# Patient Record
Sex: Female | Born: 1970 | Race: White | Hispanic: No | Marital: Married | Smoking: Never smoker
Health system: Southern US, Community
[De-identification: ages and names within clinical notes are randomized; demographics above are authoritative.]

## PROBLEM LIST (undated history)

## (undated) DIAGNOSIS — Z803 Family history of malignant neoplasm of breast: Secondary | ICD-10-CM

## (undated) DIAGNOSIS — F419 Anxiety disorder, unspecified: Secondary | ICD-10-CM

## (undated) DIAGNOSIS — M6289 Other specified disorders of muscle: Secondary | ICD-10-CM

## (undated) DIAGNOSIS — F32A Depression, unspecified: Secondary | ICD-10-CM

## (undated) DIAGNOSIS — Z98891 History of uterine scar from previous surgery: Secondary | ICD-10-CM

## (undated) DIAGNOSIS — F329 Major depressive disorder, single episode, unspecified: Secondary | ICD-10-CM

## (undated) DIAGNOSIS — N3010 Interstitial cystitis (chronic) without hematuria: Secondary | ICD-10-CM

## (undated) DIAGNOSIS — Z800 Family history of malignant neoplasm of digestive organs: Secondary | ICD-10-CM

## (undated) DIAGNOSIS — IMO0002 Reserved for concepts with insufficient information to code with codable children: Secondary | ICD-10-CM

## (undated) DIAGNOSIS — Z808 Family history of malignant neoplasm of other organs or systems: Secondary | ICD-10-CM

## (undated) HISTORY — DX: Family history of malignant neoplasm of breast: Z80.3

## (undated) HISTORY — DX: Family history of malignant neoplasm of other organs or systems: Z80.8

## (undated) HISTORY — DX: Family history of malignant neoplasm of digestive organs: Z80.0

## (undated) HISTORY — PX: TOE SURGERY: SHX1073

## (undated) HISTORY — PX: CYSTOSCOPY WITH HYDRODISTENSION AND BIOPSY: SHX5127

---

## 2004-12-29 ENCOUNTER — Encounter: Admission: RE | Admit: 2004-12-29 | Discharge: 2004-12-29 | Payer: Self-pay | Admitting: Obstetrics and Gynecology

## 2007-07-18 ENCOUNTER — Encounter: Payer: Self-pay | Admitting: Pulmonary Disease

## 2007-07-18 ENCOUNTER — Encounter: Admission: RE | Admit: 2007-07-18 | Discharge: 2007-07-18 | Payer: Self-pay | Admitting: Family Medicine

## 2007-08-03 ENCOUNTER — Ambulatory Visit: Payer: Self-pay | Admitting: Pulmonary Disease

## 2007-08-03 DIAGNOSIS — IMO0001 Reserved for inherently not codable concepts without codable children: Secondary | ICD-10-CM | POA: Insufficient documentation

## 2007-08-03 DIAGNOSIS — J45909 Unspecified asthma, uncomplicated: Secondary | ICD-10-CM | POA: Insufficient documentation

## 2007-08-03 DIAGNOSIS — E785 Hyperlipidemia, unspecified: Secondary | ICD-10-CM | POA: Insufficient documentation

## 2007-08-03 DIAGNOSIS — R0602 Shortness of breath: Secondary | ICD-10-CM | POA: Insufficient documentation

## 2007-08-15 ENCOUNTER — Encounter: Admission: RE | Admit: 2007-08-15 | Discharge: 2007-08-15 | Payer: Self-pay | Admitting: Family Medicine

## 2007-08-19 ENCOUNTER — Encounter: Payer: Self-pay | Admitting: Pulmonary Disease

## 2007-08-19 ENCOUNTER — Ambulatory Visit: Admission: RE | Admit: 2007-08-19 | Discharge: 2007-08-19 | Payer: Self-pay | Admitting: Pulmonary Disease

## 2007-08-22 ENCOUNTER — Encounter: Payer: Self-pay | Admitting: Pulmonary Disease

## 2007-09-05 ENCOUNTER — Encounter: Payer: Self-pay | Admitting: Pulmonary Disease

## 2007-09-05 ENCOUNTER — Ambulatory Visit: Payer: Self-pay

## 2007-09-19 ENCOUNTER — Ambulatory Visit: Payer: Self-pay | Admitting: Pulmonary Disease

## 2008-03-17 ENCOUNTER — Encounter: Admission: RE | Admit: 2008-03-17 | Discharge: 2008-03-17 | Payer: Self-pay | Admitting: Otolaryngology

## 2008-05-14 ENCOUNTER — Encounter: Admission: RE | Admit: 2008-05-14 | Discharge: 2008-06-04 | Payer: Self-pay | Admitting: Otolaryngology

## 2008-06-25 ENCOUNTER — Encounter: Admission: RE | Admit: 2008-06-25 | Discharge: 2008-06-25 | Payer: Self-pay | Admitting: Otolaryngology

## 2010-08-03 ENCOUNTER — Encounter: Payer: Self-pay | Admitting: Family Medicine

## 2012-06-19 ENCOUNTER — Inpatient Hospital Stay (HOSPITAL_COMMUNITY)
Admission: AD | Admit: 2012-06-19 | Discharge: 2012-06-22 | DRG: 370 | Disposition: A | Discharge status: Home / Self Care | Payer: BC Managed Care – PPO | Source: Ambulatory Visit | Attending: Obstetrics | Admitting: Obstetrics

## 2012-06-19 ENCOUNTER — Inpatient Hospital Stay (HOSPITAL_COMMUNITY): Payer: BC Managed Care – PPO | Admitting: Anesthesiology

## 2012-06-19 ENCOUNTER — Encounter (HOSPITAL_COMMUNITY): Payer: Self-pay | Admitting: Anesthesiology

## 2012-06-19 ENCOUNTER — Encounter (HOSPITAL_COMMUNITY)
Admission: AD | Disposition: A | Discharge status: Home / Self Care | Payer: Self-pay | Source: Ambulatory Visit | Attending: Obstetrics

## 2012-06-19 ENCOUNTER — Encounter (HOSPITAL_COMMUNITY): Payer: Self-pay | Admitting: *Deleted

## 2012-06-19 DIAGNOSIS — Z22330 Carrier of Group B streptococcus: Secondary | ICD-10-CM

## 2012-06-19 DIAGNOSIS — O99344 Other mental disorders complicating childbirth: Secondary | ICD-10-CM | POA: Diagnosis present

## 2012-06-19 DIAGNOSIS — F32A Depression, unspecified: Secondary | ICD-10-CM | POA: Diagnosis present

## 2012-06-19 DIAGNOSIS — O99892 Other specified diseases and conditions complicating childbirth: Secondary | ICD-10-CM | POA: Diagnosis present

## 2012-06-19 DIAGNOSIS — D62 Acute posthemorrhagic anemia: Secondary | ICD-10-CM | POA: Diagnosis not present

## 2012-06-19 DIAGNOSIS — F329 Major depressive disorder, single episode, unspecified: Secondary | ICD-10-CM | POA: Diagnosis present

## 2012-06-19 DIAGNOSIS — IMO0002 Reserved for concepts with insufficient information to code with codable children: Secondary | ICD-10-CM | POA: Diagnosis present

## 2012-06-19 DIAGNOSIS — F3289 Other specified depressive episodes: Secondary | ICD-10-CM | POA: Diagnosis present

## 2012-06-19 DIAGNOSIS — Z98891 History of uterine scar from previous surgery: Secondary | ICD-10-CM

## 2012-06-19 DIAGNOSIS — O4290 Premature rupture of membranes, unspecified as to length of time between rupture and onset of labor, unspecified weeks of gestation: Secondary | ICD-10-CM | POA: Diagnosis present

## 2012-06-19 DIAGNOSIS — O9903 Anemia complicating the puerperium: Secondary | ICD-10-CM | POA: Diagnosis not present

## 2012-06-19 DIAGNOSIS — F419 Anxiety disorder, unspecified: Secondary | ICD-10-CM | POA: Diagnosis present

## 2012-06-19 DIAGNOSIS — O09519 Supervision of elderly primigravida, unspecified trimester: Secondary | ICD-10-CM | POA: Diagnosis present

## 2012-06-19 DIAGNOSIS — O330 Maternal care for disproportion due to deformity of maternal pelvic bones: Principal | ICD-10-CM | POA: Diagnosis present

## 2012-06-19 HISTORY — DX: Reserved for concepts with insufficient information to code with codable children: IMO0002

## 2012-06-19 HISTORY — DX: Other specified disorders of muscle: M62.89

## 2012-06-19 HISTORY — DX: Anxiety disorder, unspecified: F41.9

## 2012-06-19 HISTORY — DX: Depression, unspecified: F32.A

## 2012-06-19 HISTORY — DX: History of uterine scar from previous surgery: Z98.891

## 2012-06-19 HISTORY — DX: Major depressive disorder, single episode, unspecified: F32.9

## 2012-06-19 HISTORY — DX: Interstitial cystitis (chronic) without hematuria: N30.10

## 2012-06-19 LAB — CBC
MCH: 30.6 pg (ref 26.0–34.0)
MCHC: 34.6 g/dL (ref 30.0–36.0)
MCV: 88.5 fL (ref 78.0–100.0)
Platelets: 212 10*3/uL (ref 150–400)
RDW: 13.5 % (ref 11.5–15.5)

## 2012-06-19 LAB — RPR: RPR Ser Ql: NONREACTIVE

## 2012-06-19 LAB — TYPE AND SCREEN
ABO/RH(D): A POS
Antibody Screen: NEGATIVE

## 2012-06-19 SURGERY — Surgical Case
Anesthesia: Epidural | Wound class: Clean Contaminated

## 2012-06-19 MED ORDER — IBUPROFEN 600 MG PO TABS: 600.0000 mg | ORAL_TABLET | Freq: Four times a day (QID) | ORAL | Status: DC | PRN

## 2012-06-19 MED ORDER — DIPHENHYDRAMINE HCL 50 MG/ML IJ SOLN: 12.5000 mg | INTRAMUSCULAR | Status: DC | PRN

## 2012-06-19 MED ORDER — ZOLPIDEM TARTRATE 5 MG PO TABS: 5.0000 mg | ORAL_TABLET | Freq: Every evening | ORAL | Status: DC | PRN

## 2012-06-19 MED ORDER — TETANUS-DIPHTH-ACELL PERTUSSIS 5-2.5-18.5 LF-MCG/0.5 IM SUSP: 0.5000 mL | Freq: Once | INTRAMUSCULAR | Status: DC

## 2012-06-19 MED ORDER — DIPHENHYDRAMINE HCL 25 MG PO CAPS: 25.0000 mg | ORAL_CAPSULE | ORAL | Status: DC | PRN

## 2012-06-19 MED ORDER — ONDANSETRON HCL 4 MG/2ML IJ SOLN
4.0000 mg | Freq: Three times a day (TID) | INTRAMUSCULAR | Status: DC | PRN
  Filled 2012-06-19: qty 2

## 2012-06-19 MED ORDER — NALBUPHINE SYRINGE 5 MG/0.5 ML
5.0000 mg | INJECTION | INTRAMUSCULAR | Status: DC | PRN
  Filled 2012-06-19 (×2): qty 1

## 2012-06-19 MED ORDER — ACETAMINOPHEN 10 MG/ML IV SOLN
1000.0000 mg | Freq: Four times a day (QID) | INTRAVENOUS | Status: AC | PRN
  Filled 2012-06-19: qty 100

## 2012-06-19 MED ORDER — SODIUM CHLORIDE 0.9 % IJ SOLN: 3.0000 mL | INTRAMUSCULAR | Status: DC | PRN

## 2012-06-19 MED ORDER — LACTATED RINGERS IV SOLN: INTRAVENOUS | Status: DC

## 2012-06-19 MED ORDER — KETOROLAC TROMETHAMINE 30 MG/ML IJ SOLN
INTRAMUSCULAR | Status: AC
  Administered 2012-06-19: 30 mg via INTRAVENOUS
  Filled 2012-06-19: qty 1

## 2012-06-19 MED ORDER — PHENYLEPHRINE HCL 10 MG/ML IJ SOLN
INTRAMUSCULAR | Status: DC | PRN
  Administered 2012-06-19 (×2): 40 ug via INTRAVENOUS

## 2012-06-19 MED ORDER — ONDANSETRON HCL 4 MG PO TABS: 4.0000 mg | ORAL_TABLET | ORAL | Status: DC | PRN

## 2012-06-19 MED ORDER — SCOPOLAMINE 1 MG/3DAYS TD PT72
1.0000 | MEDICATED_PATCH | Freq: Once | TRANSDERMAL | Status: AC
  Administered 2012-06-19: 1.5 mg via TRANSDERMAL

## 2012-06-19 MED ORDER — NALOXONE HCL 0.4 MG/ML IJ SOLN: 0.4000 mg | INTRAMUSCULAR | Status: DC | PRN

## 2012-06-19 MED ORDER — FENTANYL CITRATE 0.05 MG/ML IJ SOLN
INTRAMUSCULAR | Status: AC
  Administered 2012-06-19: 50 ug via INTRAVENOUS
  Filled 2012-06-19: qty 2

## 2012-06-19 MED ORDER — SENNOSIDES-DOCUSATE SODIUM 8.6-50 MG PO TABS
2.0000 | ORAL_TABLET | Freq: Every day | ORAL | Status: DC
  Administered 2012-06-19 – 2012-06-21 (×3): 2 via ORAL

## 2012-06-19 MED ORDER — FAMOTIDINE IN NACL 20-0.9 MG/50ML-% IV SOLN
20.0000 mg | Freq: Once | INTRAVENOUS | Status: AC
  Administered 2012-06-19: 20 mg via INTRAVENOUS
  Filled 2012-06-19: qty 50

## 2012-06-19 MED ORDER — LEVOCETIRIZINE DIHYDROCHLORIDE 5 MG PO TABS: 5.0000 mg | ORAL_TABLET | Freq: Every evening | ORAL | Status: DC

## 2012-06-19 MED ORDER — MENTHOL 3 MG MT LOZG: 1.0000 | LOZENGE | OROMUCOSAL | Status: DC | PRN

## 2012-06-19 MED ORDER — PENICILLIN G POTASSIUM 5000000 UNITS IJ SOLR
5.0000 10*6.[IU] | Freq: Once | INTRAVENOUS | Status: AC
  Administered 2012-06-19: 5 10*6.[IU] via INTRAVENOUS
  Filled 2012-06-19 (×2): qty 5

## 2012-06-19 MED ORDER — LIDOCAINE HCL (PF) 1 % IJ SOLN: 30.0000 mL | INTRAMUSCULAR | Status: DC | PRN

## 2012-06-19 MED ORDER — DIPHENHYDRAMINE HCL 25 MG PO CAPS: 25.0000 mg | ORAL_CAPSULE | Freq: Four times a day (QID) | ORAL | Status: DC | PRN

## 2012-06-19 MED ORDER — OXYCODONE-ACETAMINOPHEN 5-325 MG PO TABS: 1.0000 | ORAL_TABLET | ORAL | Status: DC | PRN

## 2012-06-19 MED ORDER — PRENATAL MULTIVITAMIN CH
1.0000 | ORAL_TABLET | Freq: Every day | ORAL | Status: DC
  Administered 2012-06-20 – 2012-06-22 (×4): 1 via ORAL
  Filled 2012-06-19 (×4): qty 1

## 2012-06-19 MED ORDER — MIDAZOLAM HCL 2 MG/2ML IJ SOLN: 0.5000 mg | Freq: Once | INTRAMUSCULAR | Status: DC | PRN

## 2012-06-19 MED ORDER — IBUPROFEN 600 MG PO TABS
600.0000 mg | ORAL_TABLET | Freq: Four times a day (QID) | ORAL | Status: DC
  Administered 2012-06-20 – 2012-06-22 (×10): 600 mg via ORAL
  Filled 2012-06-19 (×12): qty 1

## 2012-06-19 MED ORDER — DIPHENHYDRAMINE HCL 50 MG/ML IJ SOLN
INTRAMUSCULAR | Status: DC | PRN
  Administered 2012-06-19: 12.5 mg via INTRAVENOUS

## 2012-06-19 MED ORDER — BUPIVACAINE IN DEXTROSE 0.75-8.25 % IT SOLN
INTRATHECAL | Status: DC | PRN
  Administered 2012-06-19: 1.4 mL via INTRATHECAL

## 2012-06-19 MED ORDER — DIBUCAINE 1 % RE OINT: 1.0000 "application " | TOPICAL_OINTMENT | RECTAL | Status: DC | PRN

## 2012-06-19 MED ORDER — KETOROLAC TROMETHAMINE 30 MG/ML IJ SOLN
30.0000 mg | Freq: Four times a day (QID) | INTRAMUSCULAR | Status: AC | PRN
  Administered 2012-06-19 (×2): 30 mg via INTRAVENOUS
  Filled 2012-06-19: qty 1

## 2012-06-19 MED ORDER — EPHEDRINE SULFATE 50 MG/ML IJ SOLN
INTRAMUSCULAR | Status: DC | PRN
  Administered 2012-06-19 (×3): 10 mg via INTRAVENOUS
  Administered 2012-06-19: 20 mg via INTRAVENOUS
  Administered 2012-06-19 (×3): 10 mg via INTRAVENOUS

## 2012-06-19 MED ORDER — NALOXONE HCL 1 MG/ML IJ SOLN
1.0000 ug/kg/h | INTRAVENOUS | Status: DC | PRN
  Filled 2012-06-19: qty 2

## 2012-06-19 MED ORDER — LACTATED RINGERS IV SOLN
INTRAVENOUS | Status: DC
  Administered 2012-06-19: 07:00:00 via INTRAVENOUS

## 2012-06-19 MED ORDER — ONDANSETRON HCL 4 MG/2ML IJ SOLN
INTRAMUSCULAR | Status: DC | PRN
  Administered 2012-06-19: 4 mg via INTRAVENOUS

## 2012-06-19 MED ORDER — SIMETHICONE 80 MG PO CHEW: 80.0000 mg | CHEWABLE_TABLET | ORAL | Status: DC | PRN

## 2012-06-19 MED ORDER — MEPERIDINE HCL 25 MG/ML IJ SOLN: 6.2500 mg | INTRAMUSCULAR | Status: DC | PRN

## 2012-06-19 MED ORDER — ONDANSETRON HCL 4 MG/2ML IJ SOLN: 4.0000 mg | Freq: Four times a day (QID) | INTRAMUSCULAR | Status: DC | PRN

## 2012-06-19 MED ORDER — OXYTOCIN 10 UNIT/ML IJ SOLN
40.0000 [IU] | INTRAVENOUS | Status: DC | PRN
  Administered 2012-06-19: 40 [IU] via INTRAVENOUS

## 2012-06-19 MED ORDER — ONDANSETRON HCL 4 MG/2ML IJ SOLN
4.0000 mg | INTRAMUSCULAR | Status: DC | PRN
  Administered 2012-06-19: 4 mg via INTRAVENOUS

## 2012-06-19 MED ORDER — PENICILLIN G POTASSIUM 5000000 UNITS IJ SOLR: 5.0000 10*6.[IU] | Freq: Once | INTRAVENOUS | Status: DC

## 2012-06-19 MED ORDER — OXYCODONE-ACETAMINOPHEN 5-325 MG PO TABS
1.0000 | ORAL_TABLET | ORAL | Status: DC | PRN
  Administered 2012-06-20 (×3): 2 via ORAL
  Administered 2012-06-20 – 2012-06-21 (×2): 1 via ORAL
  Administered 2012-06-21: 2 via ORAL
  Administered 2012-06-22: 1 via ORAL
  Administered 2012-06-22: 2 via ORAL
  Filled 2012-06-19: qty 1
  Filled 2012-06-19: qty 2
  Filled 2012-06-19: qty 1
  Filled 2012-06-19: qty 2
  Filled 2012-06-19 (×2): qty 1
  Filled 2012-06-19 (×3): qty 2
  Filled 2012-06-19: qty 1

## 2012-06-19 MED ORDER — ACETAMINOPHEN 325 MG PO TABS: 650.0000 mg | ORAL_TABLET | ORAL | Status: DC | PRN

## 2012-06-19 MED ORDER — PENICILLIN G POTASSIUM 5000000 UNITS IJ SOLR: 2.5000 10*6.[IU] | INTRAVENOUS | Status: DC

## 2012-06-19 MED ORDER — CITRIC ACID-SODIUM CITRATE 334-500 MG/5ML PO SOLN: 30.0000 mL | ORAL | Status: DC | PRN

## 2012-06-19 MED ORDER — METOCLOPRAMIDE HCL 5 MG/ML IJ SOLN
10.0000 mg | Freq: Three times a day (TID) | INTRAMUSCULAR | Status: DC | PRN
  Filled 2012-06-19: qty 2

## 2012-06-19 MED ORDER — LACTATED RINGERS IV SOLN
INTRAVENOUS | Status: DC | PRN
  Administered 2012-06-19 (×2): via INTRAVENOUS

## 2012-06-19 MED ORDER — PROMETHAZINE HCL 25 MG/ML IJ SOLN: 6.2500 mg | INTRAMUSCULAR | Status: DC | PRN

## 2012-06-19 MED ORDER — NALBUPHINE SYRINGE 5 MG/0.5 ML
5.0000 mg | INJECTION | INTRAMUSCULAR | Status: DC | PRN
  Administered 2012-06-19: 5 mg via INTRAVENOUS
  Filled 2012-06-19: qty 1

## 2012-06-19 MED ORDER — CEFAZOLIN SODIUM-DEXTROSE 2-3 GM-% IV SOLR
2.0000 g | INTRAVENOUS | Status: AC
  Administered 2012-06-19: 2 g via INTRAVENOUS
  Filled 2012-06-19: qty 50

## 2012-06-19 MED ORDER — FENTANYL CITRATE 0.05 MG/ML IJ SOLN
25.0000 ug | INTRAMUSCULAR | Status: DC | PRN
  Administered 2012-06-19: 50 ug via INTRAVENOUS

## 2012-06-19 MED ORDER — LANOLIN HYDROUS EX OINT: 1.0000 "application " | TOPICAL_OINTMENT | CUTANEOUS | Status: DC | PRN

## 2012-06-19 MED ORDER — MORPHINE SULFATE (PF) 0.5 MG/ML IJ SOLN
INTRAMUSCULAR | Status: DC | PRN
  Administered 2012-06-19: .1 mg via INTRATHECAL

## 2012-06-19 MED ORDER — OXYTOCIN 40 UNITS IN LACTATED RINGERS INFUSION - SIMPLE MED
62.5000 mL/h | INTRAVENOUS | Status: AC
  Administered 2012-06-19: 62.5 mL/h via INTRAVENOUS
  Filled 2012-06-19: qty 1000

## 2012-06-19 MED ORDER — WITCH HAZEL-GLYCERIN EX PADS: 1.0000 "application " | MEDICATED_PAD | CUTANEOUS | Status: DC | PRN

## 2012-06-19 MED ORDER — SCOPOLAMINE 1 MG/3DAYS TD PT72
MEDICATED_PATCH | TRANSDERMAL | Status: AC
  Administered 2012-06-19: 1.5 mg via TRANSDERMAL
  Filled 2012-06-19: qty 1

## 2012-06-19 MED ORDER — KETOROLAC TROMETHAMINE 30 MG/ML IJ SOLN: 30.0000 mg | Freq: Four times a day (QID) | INTRAMUSCULAR | Status: AC | PRN

## 2012-06-19 MED ORDER — LACTATED RINGERS IV SOLN: 500.0000 mL | INTRAVENOUS | Status: DC | PRN

## 2012-06-19 MED ORDER — SIMETHICONE 80 MG PO CHEW
80.0000 mg | CHEWABLE_TABLET | Freq: Three times a day (TID) | ORAL | Status: DC
  Administered 2012-06-19 – 2012-06-22 (×13): 80 mg via ORAL

## 2012-06-19 MED ORDER — FENTANYL CITRATE 0.05 MG/ML IJ SOLN
INTRAMUSCULAR | Status: DC | PRN
  Administered 2012-06-19: 25 ug via INTRAVENOUS
  Administered 2012-06-19: 50 ug via INTRAVENOUS
  Administered 2012-06-19: 25 ug via INTRATHECAL

## 2012-06-19 MED ORDER — OXYTOCIN 40 UNITS IN LACTATED RINGERS INFUSION - SIMPLE MED: 62.5000 mL/h | INTRAVENOUS | Status: DC

## 2012-06-19 MED ORDER — CITRIC ACID-SODIUM CITRATE 334-500 MG/5ML PO SOLN
30.0000 mL | Freq: Once | ORAL | Status: DC
  Filled 2012-06-19: qty 15

## 2012-06-19 MED ORDER — DIPHENHYDRAMINE HCL 50 MG/ML IJ SOLN: 25.0000 mg | INTRAMUSCULAR | Status: DC | PRN

## 2012-06-19 MED ORDER — OXYTOCIN BOLUS FROM INFUSION: 500.0000 mL | INTRAVENOUS | Status: DC

## 2012-06-19 SURGICAL SUPPLY — 32 items
CLOTH BEACON ORANGE TIMEOUT ST (SAFETY) ×2 IMPLANT
CONTAINER PREFILL 10% NBF 15ML (MISCELLANEOUS) IMPLANT
DRSG OPSITE POSTOP 4X10 (GAUZE/BANDAGES/DRESSINGS) ×1 IMPLANT
DURAPREP 26ML APPLICATOR (WOUND CARE) ×2 IMPLANT
ELECT REM PT RETURN 9FT ADLT (ELECTROSURGICAL) ×2
ELECTRODE REM PT RTRN 9FT ADLT (ELECTROSURGICAL) ×1 IMPLANT
EXTRACTOR VACUUM KIWI (MISCELLANEOUS) IMPLANT
EXTRACTOR VACUUM M CUP 4 TUBE (SUCTIONS) IMPLANT
GLOVE BIO SURGEON STRL SZ 6.5 (GLOVE) ×2 IMPLANT
GLOVE BIOGEL PI IND STRL 7.0 (GLOVE) ×2 IMPLANT
GLOVE BIOGEL PI INDICATOR 7.0 (GLOVE) ×2
GOWN PREVENTION PLUS LG XLONG (DISPOSABLE) ×6 IMPLANT
KIT ABG SYR 3ML LUER SLIP (SYRINGE) ×1 IMPLANT
NDL HYPO 25X5/8 SAFETYGLIDE (NEEDLE) IMPLANT
NEEDLE HYPO 25X5/8 SAFETYGLIDE (NEEDLE) ×2 IMPLANT
NS IRRIG 1000ML POUR BTL (IV SOLUTION) ×2 IMPLANT
PACK C SECTION WH (CUSTOM PROCEDURE TRAY) ×2 IMPLANT
PAD OB MATERNITY 4.3X12.25 (PERSONAL CARE ITEMS) ×1 IMPLANT
SLEEVE SCD COMPRESS KNEE MED (MISCELLANEOUS) ×1 IMPLANT
STAPLER VISISTAT 35W (STAPLE) IMPLANT
STRIP CLOSURE SKIN 1/2X4 (GAUZE/BANDAGES/DRESSINGS) ×1 IMPLANT
SUT MON AB 4-0 PS1 27 (SUTURE) ×1 IMPLANT
SUT PLAIN 0 NONE (SUTURE) IMPLANT
SUT PLAIN 2 0 XLH (SUTURE) IMPLANT
SUT VIC AB 0 CT1 36 (SUTURE) ×2 IMPLANT
SUT VIC AB 0 CTX 36 (SUTURE) ×6
SUT VIC AB 0 CTX36XBRD ANBCTRL (SUTURE) ×3 IMPLANT
SUT VIC AB 2-0 CT1 27 (SUTURE) ×2
SUT VIC AB 2-0 CT1 TAPERPNT 27 (SUTURE) ×1 IMPLANT
TOWEL OR 17X24 6PK STRL BLUE (TOWEL DISPOSABLE) ×4 IMPLANT
TRAY FOLEY CATH 14FR (SET/KITS/TRAYS/PACK) IMPLANT
WATER STERILE IRR 1000ML POUR (IV SOLUTION) ×2 IMPLANT

## 2012-06-19 NOTE — Anesthesia Postprocedure Evaluation (Signed)
Anesthesia Post Note  Patient: Karen Pruitt  Procedure(s) Performed: Procedure(s) (LRB): CESAREAN SECTION (N/A)  Anesthesia type: Spinal  Patient location: PACU  Post pain: Pain level controlled  Post assessment: Post-op Vital signs reviewed  Last Vitals:  Filed Vitals:   06/19/12 1345  BP: 146/97  Pulse: 56  Temp:   Resp: 16    Post vital signs: Reviewed  Level of consciousness: awake  Complications: No apparent anesthesia complications

## 2012-06-19 NOTE — Brief Op Note (Signed)
06/19/2012  12:36 PM  PATIENT:  Karen Pruitt  41 y.o. female  PRE-OPERATIVE DIAGNOSIS:  [redacted] weeks gestation ruptured, GBS positive, pelvic floor dysfunction, planned elective primary cesarean section POST-OPERATIVE DIAGNOSIS:  [redacted] weeks gestation ruptured, GBS positive, pelvic floor dysfunction, planned elective primary cesarean section  PROCEDURE:  Procedure(s) (LRB) with comments: CESAREAN SECTION (N/A) - Primary Cesarean Section   SURGEON:  Surgeon(s) and Role:    * Othel Dicostanzo A. Ernestina Penna, MD - Primary  PHYSICIAN ASSISTANT:   ASSISTANTS: none   ANESTHESIA:   spinal  EBL:  Total I/O In: 1000 [I.V.:1000] Out: 850 [Urine:250; Blood:600]  BLOOD ADMINISTERED:none  DRAINS: Urinary Catheter (Foley)   LOCAL MEDICATIONS USED:  NONE  SPECIMEN:  Source of Specimen:  Placenta, cord pH  DISPOSITION OF SPECIMEN:  Cord pH to lab, placenta to labor and delivery  COUNTS:  YES  TOURNIQUET:  * No tourniquets in log *  DICTATION: .Note written in EPIC  PLAN OF CARE: Admit to inpatient   PATIENT DISPOSITION:  PACU - hemodynamically stable.   Delay start of Pharmacological VTE agent (>24hrs) due to surgical blood loss or risk of bleeding: yes

## 2012-06-19 NOTE — Anesthesia Preprocedure Evaluation (Signed)
Anesthesia Evaluation  Patient identified by MRN, date of birth, ID band Patient awake    Reviewed: Allergy & Precautions, H&P , Patient's Chart, lab work & pertinent test results  Airway Mallampati: II TM Distance: >3 FB Neck ROM: full    Dental No notable dental hx.    Pulmonary neg pulmonary ROS, shortness of breath, asthma ,  breath sounds clear to auscultation  Pulmonary exam normal       Cardiovascular negative cardio ROS  Rhythm:regular Rate:Normal     Neuro/Psych PSYCHIATRIC DISORDERS Anxiety Depression  Neuromuscular disease negative neurological ROS  negative psych ROS   GI/Hepatic negative GI ROS, Neg liver ROS,   Endo/Other  negative endocrine ROS  Renal/GU negative Renal ROS     Musculoskeletal   Abdominal   Peds  Hematology negative hematology ROS (+)   Anesthesia Other Findings   Reproductive/Obstetrics (+) Pregnancy                           Anesthesia Physical Anesthesia Plan  ASA: II  Anesthesia Plan: Epidural   Post-op Pain Management:    Induction:   Airway Management Planned:   Additional Equipment:   Intra-op Plan:   Post-operative Plan:   Informed Consent: I have reviewed the patients History and Physical, chart, labs and discussed the procedure including the risks, benefits and alternatives for the proposed anesthesia with the patient or authorized representative who has indicated his/her understanding and acceptance.     Plan Discussed with:   Anesthesia Plan Comments:         Anesthesia Quick Evaluation

## 2012-06-19 NOTE — MAU Note (Signed)
Paulino Door CNM on unit and aware of pt's admission and status. AMnisure ordered

## 2012-06-19 NOTE — H&P (Signed)
Karen Pruitt is a 41 y.o. G1P0 at [redacted]w[redacted]d presenting for  rupture of membranes . Pt notes  mild contractions  that started after rupture of membranes  . Good fetal movement, No vaginal bleeding,  started leaking fluid  at 4 AM.  [facility] since first trimester  - History of pelvic floor dysfunction and recurrent UTI. Patient has needed long-term Valium use and physical therapy for significant pain associated with her pelvic floor.  After long discussions with her urologist, physical therapist, psychiatrist and myself the decision has been to proceed with primary elective cesarean section to avoid any potential trauma and anxiety related to the pelvic floor.  - Subclinical hypothyroidism. TSH has been followed in pregnancy and has been normal  - Anxiety. Small component of depression. Follows with psychiatry. Currently off medications for pregnancy  - GBS positive  - Fetal growth. Ultrasound at 35 and 4 at the 36th percentile .    Prenatal Transfer Tool  Maternal Diabetes: No Genetic Screening: Normal Maternal Ultrasounds/Referrals: Normal Fetal Ultrasounds or other Referrals:  None Maternal Substance Abuse:  No Significant Maternal Medications:  None Significant Maternal Lab Results: None     OB History    Grav Para Term Preterm Abortions TAB SAB Ect Mult Living   1              Past Medical History  Diagnosis Date  . Interstitial cystitis   . Pelvic floor dysfunction   . Anxiety   . Depression    Past Surgical History  Procedure Date  . Toe surgery   . Cystoscopy with hydrodistension and biopsy    Family History: family history is negative for Other. Social History:  reports that she has never smoked. She does not have any smokeless tobacco history on file. She reports that she does not drink alcohol or use illicit drugs.  Review of Systems - Negative except Rupture of membranes     Temperature 98.2 F (36.8 C), temperature source Oral, resp. rate 20,  height 4\' 11"  (1.499 m), weight 61.236 kg (135 lb).  Physical Exam:  Gen: well appearing, no distress CV: RRR Pulm: CTAB Back: no CVAT Abd: gravid, NT, no RUQ pain LE: no  edema, equal bilaterally, non-tender Toco: irreg q 5 min FH: baseline 140s, accelerations present, no deceleratons, 10 beat variability GU: gross rupture, fern positive  Prenatal labs: ABO, Rh:  A+ Antibody:  neg Rubella:  immune RPR:   NR HBsAg:   neg HIV:   neg GBS:   positive 1 hr Glucola 108  Genetic screening nl AFP, nl NT Anatomy US normal other than EIF (isolated)   CBC    Component Value Date/Time   WBC 13.2* 06/19/2012 0650   RBC 3.82* 06/19/2012 0650   HGB 11.7* 06/19/2012 0650   HCT 33.8* 06/19/2012 0650   PLT 212 06/19/2012 0650   MCV 88.5 06/19/2012 0650   MCH 30.6 06/19/2012 0650   MCHC 34.6 06/19/2012 0650   RDW 13.5 06/19/2012 0650       Assessment/Plan: 41 y.o. G1P0 at [redacted]w[redacted]d  membranes with plans for elective primary cesarean section. Given that patient is greater than 34 weeks I believe the risks of expectant management R. greater than risk of delivery at this time. No indication for mag sulfate or betamethasone. Will proceed with primary cesarean section. Patient is aware of risks benefits of the operative procedure including bleeding, infection, damage to surrounding internal organs. - Reactive fetal status at this time.  Awaiting the OR for cesarean section - GBS positive. Will start antibiotics as awaiting the OR.  Lytle Malburg A. 06/19/2012 7:43 AM     Brixton Schnapp A. 06/19/2012, 7:37 AM

## 2012-06-19 NOTE — MAU Note (Signed)
I started leaking about an hour ago and cont. Leaking a lot of clear fld

## 2012-06-19 NOTE — Transfer of Care (Signed)
Immediate Anesthesia Transfer of Care Note  Patient: Karen Pruitt  Procedure(s) Performed: Procedure(s) (LRB) with comments: CESAREAN SECTION (N/A) - Primary Cesarean Section   Patient Location: PACU  Anesthesia Type:Spinal  Level of Consciousness: awake, alert  and oriented  Airway & Oxygen Therapy: Patient Spontanous Breathing  Post-op Assessment: Report given to PACU RN and Post -op Vital signs reviewed and stable  Post vital signs: Reviewed and stable  Complications: No apparent anesthesia complications

## 2012-06-19 NOTE — Anesthesia Procedure Notes (Signed)
Spinal  Patient location during procedure: OR Start time: 06/19/2012 11:41 AM Staffing Anesthesiologist: Brayton Caves R Performed by: anesthesiologist  Preanesthetic Checklist Completed: patient identified, site marked, surgical consent, pre-op evaluation, timeout performed, IV checked, risks and benefits discussed and monitors and equipment checked Spinal Block Patient position: sitting Prep: DuraPrep Patient monitoring: heart rate, cardiac monitor, continuous pulse ox and blood pressure Approach: midline Location: L3-4 Injection technique: single-shot Needle Needle type: Sprotte  Needle gauge: 24 G Needle length: 9 cm Assessment Sensory level: T4 Additional Notes Patient identified.  Risk benefits discussed including failed block, incomplete pain control, headache, nerve damage, paralysis, blood pressure changes, nausea, vomiting, reactions to medication both toxic or allergic, and postpartum back pain.  Patient expressed understanding and wished to proceed.  All questions were answered.  Sterile technique used throughout procedure.  CSF was clear.  No parasthesia or other complications.  Please see nursing notes for vital signs.

## 2012-06-19 NOTE — Op Note (Signed)
06/19/2012  12:36 PM  PATIENT:  Karen Pruitt  41 y.o. female  PRE-OPERATIVE DIAGNOSIS:  [redacted] weeks gestation ruptured, GBS positive, pelvic floor dysfunction, planned elective primary cesarean section POST-OPERATIVE DIAGNOSIS:  [redacted] weeks gestation ruptured, GBS positive, pelvic floor dysfunction, planned elective primary cesarean section  PROCEDURE:  Procedure(s) (LRB) with comments: CESAREAN SECTION (N/A) - Primary Cesarean Section   SURGEON:  Surgeon(s) and Role:    * Payslee Bateson A. Ernestina Penna, MD - Primary  PHYSICIAN ASSISTANT:   ASSISTANTS: none   ANESTHESIA:   spinal  EBL:  Total I/O In: 1000 [I.V.:1000] Out: 850 [Urine:250; Blood:600]  BLOOD ADMINISTERED:none  DRAINS: Urinary Catheter (Foley)   LOCAL MEDICATIONS USED:  NONE  SPECIMEN:  Source of Specimen:  Placenta, cord pH  DISPOSITION OF SPECIMEN:  Cord pH to lab, placenta to labor and delivery  COUNTS:  YES  TOURNIQUET:  * No tourniquets in log *  DICTATION: .Note written in EPIC  PLAN OF CARE: Admit to inpatient   PATIENT DISPOSITION:  PACU - hemodynamically stable.   Delay start of Pharmacological VTE agent (>24hrs) due to surgical blood loss or risk of bleeding: yes   Findings:  @BABYSEXEBC @ infant,  APGAR (1 MIN): 8   APGAR (5 MINS): 8   APGAR (10 MINS): 9    Normal uterus tubes and ovaries, normal placenta, three-vessel cord, clear amniotic fluid, vertex presentation EBL: 600 cc Antibiotics:  2 g of Ancef preoperatively, 5000 units penicillin while awaiting cesarean section given GBS positive  Complications: none  Indications: This is a 41 y.o. year-old, G1  At [redacted]w[redacted]d admitted for rupture of membranes. Patient notes rupture of membranes at 4 AM. Onset of mild contractions shortly thereafter. The patient presented to the ED a proximally 8 AM. Confirm rupture of membranes. Again addressed patient's desire for elective primary cesarean section due to history of significant pelvic floor dysfunction and  anxiety. This decision had been made in conjunction with patient's urologist and psychiatrist. Patient was aware of the risks of cesarean section and the overall benefits of a vaginal delivery. As the patient was not in labor and fetal testing remained reactive. Her cesarean section was delayed to do to hospital circumstances. Patient was started on penicillin given her group B strep status. I was immediately available and checked on the patient multiple times while awaiting cesarean section.. Risks benefits and alternatives of the procedure were discussed with the patient who agreed to proceed  Procedure:  After informed consent was obtained the patient was taken to the operating room where spinal anesthesia was initiated.  She was prepped and draped in the normal sterile fashion in dorsal supine position with a leftward tilt.  A foley catheter was inserted sterilely into the bladder. Gloves were changes and attention was turned to the patient's abdomen. A Pfannenstiel skin incision was made 2 cm above the pubic symphysis in the midline with the scalpel.  Dissection was carried down with the Bovie cautery until the fascia was reached. The fascia was incised in the midline. The incision was extended laterally with the Mayo scissors. The inferior aspect of the fascial incision was grasped with the Coker clamps, elevated up and the underlying rectus muscles were dissected off sharply. The superior aspect of the fascial incision was grasped with the Coker clamps elevated up and the underlying rectus muscles were dissected off sharply.  The peritoneum was entered bluntly. The peritoneal incision was extended superiorly and inferiorly with good visualization of the bladder. The bladder blade was  inserted, the vesicouterine peritoneum was identified grasped with the pickups and entered sharply. The bladder flap was created digitally the bladder blade was reinserted. Palpation was done to assess the fetal position and  the location of the uterine vessels. The lower segment of the uterus was incised sharply with the scalpel and extended superiorly and laterally with the bandage scissors. The infant also was grasped brought to the incision,  rotated and the infant was delivered with fundal pressure. The nose and mouth were bulb suctioned. The cord was clamped and cut. The infant was handed off to the waiting pediatrician. The placenta was expressed. The uterus was exteriorized. The uterus was cleared of all clots and debris. The uterine incision was repaired with 0 Vicryl in a running locked fashion.  A second layer of the same suture was used in an imbricating fashion to obtain excellent hemostasis. A single additional figure-of-eight suture was placed in the midpoint of the incision for good hemostasis The uterus was then returned to the abdomen, the gutters were cleared of all clots and debris. The uterine incision was reinspected and found to be hemostatic. The peritoneum was grasped and closed with 2-0 Vicryl in a running fashion. The cut muscle edges and the underside of the fascia were inspected and found to be hemostatic. The fascia was closed with 0 Vicryl in a single layer. The subcutaneous tissue was irrigated. Scarpa's layer was closed with a 2-0 plain gut suture. The skin was closed with a 4-0 Monocryl in a single layer. The patient tolerated the procedure well. Sponge lap and needle counts were correct x3 and patient was taken to the recovery room in a stable condition.  Rudell Ortman A. 06/19/2012 12:38 PM

## 2012-06-19 NOTE — MAU Provider Note (Signed)
  History   This patient presented with a history of possible SROM at [redacted]w[redacted]d G1P0000 To MAU to evaluate if true rupture of membranes. Scheduled for PLTCS  On 07/11/12 due to Pelvic Floor issues.  CSN: [id number]  Arrival date and time: 06/19/12 0537   None     Chief Complaint  Patient presents with  . Rupture of Membranes   HPI  OB History    Grav Para Term Preterm Abortions TAB SAB Ect Mult Living   1               Past Medical History  Diagnosis Date  . Interstitial cystitis   . Pelvic floor dysfunction   . Anxiety   . Depression     Past Surgical History  Procedure Date  . Toe surgery   . Cystoscopy with hydrodistension and biopsy     Family History  Problem Relation Age of Onset  . Other Neg Hx     History  Substance Use Topics  . Smoking status: Never Smoker   . Smokeless tobacco: Not on file  . Alcohol Use: No    Allergies: No Known Allergies  Prescriptions prior to admission  Medication Sig Dispense Refill  . diphenhydrAMINE (SOMINEX) 25 MG tablet Take 25 mg by mouth at bedtime as needed.      Marland Kitchen levocetirizine (XYZAL) 5 MG tablet Take 5 mg by mouth every evening.      . Prenatal Vit-Fe Fumarate-FA (PRENATAL MULTIVITAMIN) TABS Take 1 tablet by mouth daily.        Review of Systems  Constitutional: Negative.   HENT: Negative.   Eyes: Negative.   Respiratory: Negative.   Cardiovascular: Negative.   Gastrointestinal: Negative.   Genitourinary: Negative.   Musculoskeletal: Negative.   Skin: Negative.   Neurological: Negative.   Endo/Heme/Allergies: Negative.   Psychiatric/Behavioral: Negative.    Physical Exam   Temperature 98.2 F (36.8 C), temperature source Oral, resp. rate 20, height 4\' 11"  (1.499 m), weight 61.236 kg (135 lb).  Physical Exam  Constitutional: She is oriented to person, place, and time. She appears well-developed and well-nourished.  HENT:  Head: Normocephalic and atraumatic.  Eyes: Conjunctivae normal and EOM are  normal. Pupils are equal, round, and reactive to light.  Neck: Normal range of motion. Neck supple.  Cardiovascular: Normal rate, regular rhythm and normal heart sounds.   Respiratory: Effort normal and breath sounds normal.  GI: Soft.       Fundus to dates  Genitourinary: Vagina normal.       Gross SROM - clear fluid at 04.00hrs  Musculoskeletal: Normal range of motion.  Neurological: She is alert and oriented to person, place, and time. She has normal reflexes.  Skin: Skin is warm and dry.  Psychiatric: She has a normal mood and affect.    MAU Course  Procedures  Ferning: positive  Gross SROM GBS Positive - prophylaxis managment IV access Labs: CBC, Type and Screen   Assessment and Plan  Collaborative management with Dr. Ernestina Penna  (Dr. Ernestina Penna is patient's Primary MD) Gross SROM, was scheduled for [facility] 07/11/12. Dr Ernestina Penna to  Bedside. Prep for PLTCS at [redacted]w[redacted]d. GBS prophylaxis - Pen G.   Brandilynn Taormina, CNM. 06/19/2012, 6:21 AM

## 2012-06-20 ENCOUNTER — Encounter (HOSPITAL_COMMUNITY): Payer: Self-pay

## 2012-06-20 DIAGNOSIS — Z98891 History of uterine scar from previous surgery: Secondary | ICD-10-CM

## 2012-06-20 DIAGNOSIS — F329 Major depressive disorder, single episode, unspecified: Secondary | ICD-10-CM | POA: Diagnosis present

## 2012-06-20 DIAGNOSIS — IMO0002 Reserved for concepts with insufficient information to code with codable children: Secondary | ICD-10-CM

## 2012-06-20 DIAGNOSIS — F419 Anxiety disorder, unspecified: Secondary | ICD-10-CM | POA: Diagnosis present

## 2012-06-20 DIAGNOSIS — F32A Depression, unspecified: Secondary | ICD-10-CM

## 2012-06-20 HISTORY — DX: Depression, unspecified: F32.A

## 2012-06-20 HISTORY — DX: History of uterine scar from previous surgery: Z98.891

## 2012-06-20 HISTORY — DX: Reserved for concepts with insufficient information to code with codable children: IMO0002

## 2012-06-20 HISTORY — DX: Anxiety disorder, unspecified: F41.9

## 2012-06-20 MED ORDER — LORATADINE 10 MG PO TABS
10.0000 mg | ORAL_TABLET | Freq: Every day | ORAL | Status: DC
  Administered 2012-06-20 – 2012-06-22 (×3): 10 mg via ORAL
  Filled 2012-06-20 (×4): qty 1

## 2012-06-20 NOTE — Progress Notes (Signed)
Subjective: POD# 1 Information for the patient's newborn:  Karen Pruitt, [place] Girl Lexie [[id number]]  female NICU admit - prematurity / stable  Reports feeling well. Feeding: breast / pumping Patient reports tolerating PO.  Breast symptoms: +colostrum Pain controlled with Motrin and Percocet. Denies HA/SOB/C/P/N/V/dizziness. Flatus present, no BM. She reports vaginal bleeding as normal, without clots.  She is ambulating, urinating without difficult.     Objective:   VS:  Filed Vitals:   06/19/12 1830 06/19/12 2151 06/20/12 0146 06/20/12 0532  BP: 113/69 111/70 97/65 98/56   Pulse: 57 61 45 57  Temp: 98.3 F (36.8 C) 97.9 F (36.6 C) 97.8 F (36.6 C) 97.9 F (36.6 C)  TempSrc: Oral Axillary Oral Oral  Resp: 16 18 16 16   Height:      Weight:      SpO2: 100% 100% 100% 98%     Intake/Output Summary (Last 24 hours) at 06/20/12 1049 Last data filed at 06/20/12 1000  Gross per 24 hour  Intake   2850 ml  Output   2075 ml  Net    775 ml        Basename 06/20/12 0505 06/19/12 0650  WBC 15.1* 13.2*  HGB 8.6* 11.7*  HCT 25.0* 33.8*  PLT 163 212     Blood type: --/--/A POS, A POS (12/08 0650)  Rubella:   immune  TDaP anf flu vaccine done   Physical Exam:  General: alert, cooperative and no distress CV: Regular rate and rhythm Resp: clear Abdomen: soft, nontender, normal bowel sounds Incision: well approximated, no redness / swelling, bloody and old, half opsite dressing soiled drainage present  Uterine Fundus: firm, below umbilicus, nontender Lochia: minimal Ext: Homans sign is negative, no sign of DVT and no edema, redness or tenderness in the calves or thighs      Assessment/Plan: 41 y.o.   POD# 1. [id number]                  Active Problems:  S/P cesarean section - elective primary / hx pelvic floor dysfunction and anxiety - 12/8  Postpartum care following cesarean delivery  Maternal anemia complicating pregnancy, childbirth, or the puerperium  Iron deficiency anemia  of pregnancy with superimposed acute blood loss anemia - asymptomatic  Anxiety and depression  Managed by Dr. Westley Chandler, was on antidepressant until mid-pregnancy then weaned off (does not remember name)  Discussed increased risk of PPD given hx depression/anxiety and increased stressors of infant in NICU, recc resuming antidepressant. Patient agrees, spouse will go home and bring previous Rx to hospital, will check for compatibility w./ breastfeeding and resume if safe.   Doing well, stable post-op.    Plan oral Fe at discharge Continue  Ambulate Routine post-op care  Wilmer Berryhill 06/20/2012, 10:49 AM

## 2012-06-20 NOTE — Plan of Care (Signed)
Problem: Phase II Progression Outcomes Goal: Pain controlled on oral analgesia Outcome: Not Applicable Date Met:  06/20/12 Patient does not require po pain med at this time Goal: Progress activity as tolerated unless otherwise ordered Outcome: Completed/Met Date Met:  06/20/12 Patient has walked to bathroom and visited NICU in a wheelchair and tolerated this well Goal: Afebrile, VS remain stable Outcome: Completed/Met Date Met:  06/20/12 VSS at this time

## 2012-06-20 NOTE — Anesthesia Postprocedure Evaluation (Signed)
  Anesthesia Post-op Note  Patient: Karen Pruitt  Procedure(s) Performed: Procedure(s) (LRB) with comments: CESAREAN SECTION (N/A) - Primary Cesarean Section   Patient Location: A-ICU  Anesthesia Type:Spinal  Level of Consciousness: awake  Airway and Oxygen Therapy: Patient Spontanous Breathing  Post-op Pain: none  Post-op Assessment: Patient's Cardiovascular Status Stable, Respiratory Function Stable, Patent Airway, No signs of Nausea or vomiting, Adequate PO intake, Pain level controlled, No headache, No backache, No residual numbness and No residual motor weakness  Post-op Vital Signs: Reviewed and stable  Complications: No apparent anesthesia complications

## 2012-06-20 NOTE — Clinical Social Work Psychosocial (Signed)
Clinical Social Work Department BRIEF PSYCHOSOCIAL ASSESSMENT 06/20/2012  Patient:  Karen Pruitt, Karen Pruitt     Account Number:  [id number]     Admit date:  06/19/2012  Clinical Social Worker:  Jodelle Red  Date/Time:  06/20/2012 12:03 PM  Referred by:  CSW  Date Referred:  06/20/2012 Referred for  Other - See comment   Other Referral:   H/O DEPRRESSION AND ANXIETY   Interview type:  Patient Other interview type:   RECORD REVIEW, DISCUSSION WITH CNM    PSYCHOSOCIAL DATA Living Status:  FAMILY Admitted from facility:   Level of care:   Primary support name:  Karen Pruitt and Karen Pruitt Primary support relationship to patient:  FAMILY Degree of support available:   good from husband    CURRENT CONCERNS Current Concerns  Adjustment to Illness   Other Concerns:   none. [name] reports off depression meds since mid-pregnancy.    SOCIAL WORK ASSESSMENT / PLAN CSW met with [name] to assess needs and check in due to NICU admit and h/o depression and anxiety. [name] adjusting to NICU admit and coping well. She reports to have good support from FOB and all items for baby.  [name] denies current issues with depression and anxiety. She was on medication until midway through her pregnancy and then took herself off her meds. [name] sees Dr. Jeraldine Loots and will check with her about possible follow up. [name] aware of s&S of anxiety and depression and agrees to seek assistance as needed.   Assessment/plan status:  Psychosocial Support/Ongoing Assessment of Needs Other assessment/ plan:   reassess anxiety and depression concerns as needed   Information/referral to community resources:    PATIENT'S/FAMILY'S RESPONSE TO PLAN OF CARE: [name] agrees to seek assistance for her depression and anxiety if it resurfaces. She denies current concerns and was appreciative of CSW following. No other concerns noted currently.     Doreen Salvage, LCSW  [name] M-F 8am-12pm

## 2012-06-20 NOTE — Plan of Care (Signed)
Problem: Phase II Progression Outcomes Goal: Tolerating diet Outcome: Completed/Met Date Met:  06/20/12 Tolerated clear liquid dinner

## 2012-06-20 NOTE — Addendum Note (Signed)
Addendum  created 06/20/12 1014 by Suella Grove, CRNA   Modules edited:Notes Section

## 2012-06-21 LAB — CBC
HCT: 25 % — ABNORMAL LOW (ref 36.0–46.0)
RDW: 13.8 % (ref 11.5–15.5)
WBC: 15.1 10*3/uL — ABNORMAL HIGH (ref 4.0–10.5)

## 2012-06-21 MED ORDER — POLYSACCHARIDE IRON COMPLEX 150 MG PO CAPS
150.0000 mg | ORAL_CAPSULE | Freq: Every day | ORAL | Status: DC
  Administered 2012-06-22: 150 mg via ORAL
  Filled 2012-06-21 (×2): qty 1

## 2012-06-21 NOTE — Progress Notes (Signed)
POSTOPERATIVE DAY # 2 S/P cesarean section   S:         Reports feeling ok - tired             Tolerating po intake / no nausea / no vomiting / + flatus / no BM             Some increased swelling in hands & feet / difficulty emptying bladder at times             Bleeding is light             Pain controlled with motrin and percocet             Up ad lib / ambulatory/ voiding QS  Newborn pumping for breast-feeding  / Newborn in NICU    O:  VS: BP 146/76  Pulse 98  Temp 98 F (36.7 C) (Oral)  Resp 18  Ht 4\' 11"  (1.499 m)  Wt 61.236 kg (135 lb)  BMI 27.27 kg/m2  SpO2 99%  Breastfeeding? Unknown   LABS:  Basename 06/20/12 0505 06/19/12 0650  WBC 15.1* 13.2*  HGB 8.6* 11.7*  PLT 163 212               Physical Exam:             Alert and Oriented X3  Lungs: Clear and unlabored  Heart: regular rate and rhythm / no mumurs  Abdomen: soft, non-tender, non-distended, active BS             Fundus: firm, non-tender, U-1             Dressing intact - honeycomb              Incision:  approximated with staples / no erythema / no necchymosis /no drainage  Perineum: no edema  Lochia: light  Extremities: trace edema, no calf pain or tenderness  A:        POD # 2 S/P cesarean section            ABL anemia - postoperatively            Bladder symptoms - difficulty emptying bladder since surgery  P:        Routine postoperative care              Recommend voiding every 3 hours while awake - do not wait for urge / avoid bladder distention             Consider urinary culture if symptoms persist ( HX GBS)             Anticipatory guidance on postoperative recover - edema / pain control / activity / urinary symptoms             Plan discharge tomorrow - unless insurance will allow additional day (check with hospital for LOS)   Karen Pruitt CNM, MSN 06/21/2012, 5:40 PM

## 2012-06-21 NOTE — Progress Notes (Addendum)
Patient not in room - in NICU visiting newborn  Will visit later this am for exam and rounds  Marlinda Mike CNM   Revisit at 1045am - not in room - TBCNM   Revisit at 1240 pm - not in room - Retry after office hours - TBCNM

## 2012-06-22 ENCOUNTER — Encounter (HOSPITAL_COMMUNITY)
Admission: RE | Admit: 2012-06-22 | Discharge: 2012-06-22 | Disposition: A | Discharge status: Home / Self Care | Payer: BC Managed Care – PPO | Source: Ambulatory Visit | Attending: Obstetrics | Admitting: Obstetrics

## 2012-06-22 DIAGNOSIS — O923 Agalactia: Secondary | ICD-10-CM | POA: Insufficient documentation

## 2012-06-22 MED ORDER — POLYSACCHARIDE IRON COMPLEX 150 MG PO CAPS: 150.0000 mg | ORAL_CAPSULE | Freq: Every day | ORAL | Status: DC

## 2012-06-22 MED ORDER — IBUPROFEN 600 MG PO TABS: 600.0000 mg | ORAL_TABLET | Freq: Four times a day (QID) | ORAL | Status: DC | PRN

## 2012-06-22 MED ORDER — OXYCODONE-ACETAMINOPHEN 5-325 MG PO TABS: 1.0000 | ORAL_TABLET | ORAL | Status: DC | PRN

## 2012-06-22 NOTE — Discharge Summary (Signed)
Physician Discharge Summary  Patient ID: Karen Pruitt MRN: [id number] DOB/AGE: [birth year] 41 y.o.  Admit date: 06/19/2012 Discharge date: 06/22/2012  Admission Diagnoses:Admitted through MaU with History of PPROM at [redacted]w[redacted]d, GBS Positive and had GBS prophylaxis. Had planned for scheduled PLTCS due to Pelvic Floor Issues.  Discharge Diagnoses:  Active Problems:  S/P cesarean section - elective primary 12/8  Postpartum care following cesarean delivery  Maternal anemia complicating pregnancy, childbirth, or the puerperium  Anxiety and depression   Discharged Condition: stable  Hospital Course: PlTCS- uncomplicated course for mother. Female Infant born at [redacted]w[redacted]d Has slow transition and remains in NICU for GBS surveillance and Stabilization.  Consults: None  Significant Diagnostic Studies: labs: Routine prenatal labs - normal and radiology: Ultrasound: anatomy normal  Treatments: IV hydration, antibiotics: Ancef  And Penicillin for GBS Prophylaxis prior to time of surgery and analgesia: acetaminophen w/ codeine and Ibuprofen  Discharge Exam: Blood pressure 91/54, pulse 80, temperature 98.1 F (36.7 C), temperature source Oral, resp. rate 18, height 4\' 11"  (1.499 m), weight 135 lb (61.236 kg), SpO2 99.00%, unknown if currently breastfeeding.  ROS: General appearance: alert, cooperative and no distress Affect: AAO x 3 Breast: N/T Lungs: CTAB CV: RRR Abdomen: Soft, N/T B/S x 4 and BM since surgery. Fundus: -2/u, firm Incision: dressing remains in place. S/c suture and the patient can take down dressing at 5 days post - op - advised to place a dry dressing on top of incision to stop friction from clothing. Lochia: Min, rubra GI: normal GU: No problems voiding Extremities: Slight foot edema, no swelling, bilaterally.   Disposition: Mother id being discharged to home and Baby remains in NICU  Discharge Orders    Future Orders Please Complete By Expires   Diet general       Discharge instructions      Comments:   Per [facility] Booklet       Medication List     As of 06/22/2012 11:02 AM    STOP taking these medications         diphenhydrAMINE 25 MG tablet   Commonly known as: SOMINEX      TAKE these medications         ibuprofen 600 MG tablet   Commonly known as: ADVIL,MOTRIN   Take 1 tablet (600 mg total) by mouth every 6 (six) hours as needed for pain.      iron polysaccharides 150 MG capsule   Commonly known as: NIFEREX   Take 1 capsule (150 mg total) by mouth daily.      levocetirizine 5 MG tablet   Commonly known as: XYZAL   Take 5 mg by mouth every evening.      oxyCODONE-acetaminophen 5-325 MG per tablet   Commonly known as: PERCOCET/ROXICET   Take 1-2 tablets by mouth every 4 (four) hours as needed (moderate - severe pain).      prenatal multivitamin Tabs   Take 1 tablet by mouth daily.           Follow-up Information    Follow up with [facility] In 2 weeks. (at 6 weeeks pp / as needed)    Contact information:   [address] [place] [address] [phone]         Signed: Earl Gala, CNM. 06/22/2012, 11:02 AM

## 2012-06-22 NOTE — Progress Notes (Signed)
Subjective: Postpartum Day 3: Cesarean Delivery secondary BS:JGGE at [redacted]w[redacted]d with pos+ GBS status   Patient reports + flatus, + BM and no problems voiding.  +BM no complaints, up ad lib without syncope Pain well controlled with po meds, using motrin and percocet  BF: Pumping and baby is being fed via tube and IV at present. Mood stable, bonding well Baby remains in NICU - slow transition.  Objective: Vital signs in last 24 hours: Temp:  [98 F (36.7 C)-98.2 F (36.8 C)] 98.1 F (36.7 C) (12/11 0704) Pulse Rate:  [80-98] 80  (12/11 0704) Resp:  [18] 18  (12/11 0704) BP: (91-146)/(54-76) 91/54 mmHg (12/11 0704) SpO2:  [99 %] 99 % (12/11 0704)  Physical Exam:  General: alert, cooperative and no distress Breast: N/T Heart: RRR Lungs: CTAB Abdomen: BS x4 and BM Uterine Fundus: firm Incision: healing well, no significant drainage, no dehiscence, no significant erythema  JP drain:  Lochia: appropriate DVT Evaluation: No evidence of DVT seen on physical exam. Negative Homan's sign. No cords or calf tenderness. No significant calf/ankle edema.   Basename 06/20/12 0505  HGB 8.6*  HCT 25.0*   Anemia of pregnancy Po Iron supplement daily.  Assessment/Plan: Status post Cesarean section. Doing well postoperatively.  Discharge home with standard precautions and return to clinic in 4-6 weeks.   Esteen Delpriore, CNM. 06/22/2012, 10:51 AM

## 2012-07-11 ENCOUNTER — Inpatient Hospital Stay (HOSPITAL_COMMUNITY): Admission: RE | Admit: 2012-07-11 | Payer: Self-pay | Source: Ambulatory Visit | Admitting: Obstetrics

## 2012-07-11 ENCOUNTER — Encounter (HOSPITAL_COMMUNITY): Admission: RE | Payer: Self-pay | Source: Ambulatory Visit

## 2012-07-11 SURGERY — Surgical Case
Anesthesia: Spinal

## 2012-07-12 NOTE — Progress Notes (Signed)
History     Chief Complaint  Patient presents with  . Rupture of Membranes  GA [redacted]w[redacted]d. Gross Rupture. Had Hx of Pelvic floor Trauma and was scheduled for PLTCS at 39 weeks with Dr Ernestina Penna. Conirmed PPROM and Dr Ernestina Penna informed of patient admission. Plan to perform PLTCS today. Care ransferred to Dr Ernestina Penna as she is her primary.  OB History as of 07/04/12    Grav Para Term Preterm Abortions TAB SAB Ect Mult Living   1 1  1      1       Past Medical History  Diagnosis Date  . Interstitial cystitis   . Pelvic floor dysfunction   . Anxiety   . Depression   . S/P cesarean section - elective primary 12/8 06/20/2012  . Postpartum care following cesarean delivery 06/20/2012  . Maternal anemia complicating pregnancy, childbirth, or the puerperium 06/20/2012    Mild IDA w/ superimposed ABL anemia  . Anxiety and depression 06/20/2012    Past Surgical History  Procedure Date  . Toe surgery   . Cystoscopy with hydrodistension and biopsy   . Cesarean section 06/19/2012    Procedure: CESAREAN SECTION;  Surgeon: Tresa Endo A. Ernestina Penna, MD;  Location: [facility];  Service: Obstetrics;  Laterality: N/A;  Primary Cesarean Section     Family History  Problem Relation Age of Onset  . Other Neg Hx     History  Substance Use Topics  . Smoking status: Never Smoker   . Smokeless tobacco: Not on file  . Alcohol Use: No    Allergies: No Known Allergies  No prescriptions prior to admission   ROS: Healthy female , [redacted]w[redacted]d with PPROM confirmed. All Systems negative findings. Physical Exam  Affect: AAO x3 Lungs: CtAB CV: RRR Abdomen: Fundus to dates . Vx presentation. Soft. N/T GU: no problems voiding GI: normal Leaking clear amniotic fluid per vagina - confirmed PPROM Extremities: No edema, No swelling bilaterally.  Blood pressure 91/54, pulse 80, temperature 98.1 F (36.7 C), temperature source Oral, resp. rate 18, height 4\' 11"  (1.499 m), weight 61.236 kg (135 lb), SpO2 99.00%, unknown if  currently breastfeeding. Plans to breastfeed.  ED Course   PPROM with planned PLTCS. Care transferred to Primary MD Dr Ernestina Penna NPO Prep for OR Anesthesia informed  Direct transfer to OR from MAU when ready for surgery.   Novali Vollman CNM,  07/12/2012 6:08 AM

## 2013-06-28 ENCOUNTER — Encounter: Payer: BC Managed Care – PPO | Attending: Obstetrics

## 2013-06-28 VITALS — Ht <= 58 in | Wt 130.9 lb

## 2013-06-28 DIAGNOSIS — O99810 Abnormal glucose complicating pregnancy: Secondary | ICD-10-CM | POA: Insufficient documentation

## 2013-06-28 DIAGNOSIS — Z713 Dietary counseling and surveillance: Secondary | ICD-10-CM | POA: Insufficient documentation

## 2013-06-28 NOTE — Progress Notes (Signed)
  Patient was seen on 06/28/13 for Gestational Diabetes self-management class at the [facility]. The following learning objectives were met by the patient during this course:   States the definition of Gestational Diabetes  States why dietary management is important in controlling blood glucose  Describes the effects of carbohydrates on blood glucose levels  Demonstrates ability to create a balanced meal plan  Demonstrates carbohydrate counting   States when to check blood glucose levels  Demonstrates proper blood glucose monitoring techniques  States the effect of stress and exercise on blood glucose levels  States the importance of limiting caffeine and abstaining from alcohol and smoking  Plan:  Aim for 2 Carb Choices per meal (30 grams) +/- 1 either way for breakfast Aim for 3 Carb Choices per meal (45 grams) +/- 1 either way from lunch and dinner Aim for 1-2 Carbs per snack Begin reading food labels for Total Carbohydrate and sugar grams of foods Consider  increasing your activity level by walking daily as tolerated Begin checking BG before breakfast and 1-2 hours after first bit of breakfast, lunch and dinner after  as directed by MD  Take medication  as directed by MD  Blood glucose monitor given: None. Already testing per MD  Patient instructed to monitor glucose levels: FBS: 60 - <90 2 hour: <120  Patient received the following handouts:  Nutrition Diabetes and Pregnancy  Carbohydrate Counting List  Meal Planning worksheet  Patient will be seen for follow-up as needed.

## 2013-08-18 ENCOUNTER — Other Ambulatory Visit: Payer: Self-pay | Admitting: Obstetrics

## 2013-08-25 ENCOUNTER — Encounter (HOSPITAL_COMMUNITY): Payer: Self-pay | Admitting: Pharmacist

## 2013-09-05 ENCOUNTER — Inpatient Hospital Stay (HOSPITAL_COMMUNITY): Payer: BC Managed Care – PPO | Admitting: Anesthesiology

## 2013-09-05 ENCOUNTER — Inpatient Hospital Stay (HOSPITAL_COMMUNITY)
Admission: AD | Admit: 2013-09-05 | Discharge: 2013-09-08 | DRG: 765 | Disposition: A | Discharge status: Home / Self Care | Payer: BC Managed Care – PPO | Source: Ambulatory Visit | Attending: Obstetrics and Gynecology | Admitting: Obstetrics and Gynecology

## 2013-09-05 ENCOUNTER — Encounter (HOSPITAL_COMMUNITY): Payer: Self-pay | Admitting: *Deleted

## 2013-09-05 ENCOUNTER — Encounter (HOSPITAL_COMMUNITY)
Admission: AD | Disposition: A | Discharge status: Home / Self Care | Payer: Self-pay | Source: Ambulatory Visit | Attending: Obstetrics and Gynecology

## 2013-09-05 ENCOUNTER — Encounter (HOSPITAL_COMMUNITY): Payer: BC Managed Care – PPO | Admitting: Anesthesiology

## 2013-09-05 DIAGNOSIS — E785 Hyperlipidemia, unspecified: Secondary | ICD-10-CM

## 2013-09-05 DIAGNOSIS — O34219 Maternal care for unspecified type scar from previous cesarean delivery: Principal | ICD-10-CM | POA: Diagnosis present

## 2013-09-05 DIAGNOSIS — O9989 Other specified diseases and conditions complicating pregnancy, childbirth and the puerperium: Secondary | ICD-10-CM

## 2013-09-05 DIAGNOSIS — IMO0001 Reserved for inherently not codable concepts without codable children: Secondary | ICD-10-CM

## 2013-09-05 DIAGNOSIS — O99892 Other specified diseases and conditions complicating childbirth: Secondary | ICD-10-CM | POA: Diagnosis present

## 2013-09-05 DIAGNOSIS — D62 Acute posthemorrhagic anemia: Secondary | ICD-10-CM | POA: Diagnosis not present

## 2013-09-05 DIAGNOSIS — D696 Thrombocytopenia, unspecified: Secondary | ICD-10-CM | POA: Diagnosis present

## 2013-09-05 DIAGNOSIS — F419 Anxiety disorder, unspecified: Secondary | ICD-10-CM

## 2013-09-05 DIAGNOSIS — Z98891 History of uterine scar from previous surgery: Secondary | ICD-10-CM

## 2013-09-05 DIAGNOSIS — F32A Depression, unspecified: Secondary | ICD-10-CM

## 2013-09-05 DIAGNOSIS — J45909 Unspecified asthma, uncomplicated: Secondary | ICD-10-CM

## 2013-09-05 DIAGNOSIS — F329 Major depressive disorder, single episode, unspecified: Secondary | ICD-10-CM

## 2013-09-05 DIAGNOSIS — R0602 Shortness of breath: Secondary | ICD-10-CM

## 2013-09-05 DIAGNOSIS — Z22330 Carrier of Group B streptococcus: Secondary | ICD-10-CM

## 2013-09-05 DIAGNOSIS — IMO0002 Reserved for concepts with insufficient information to code with codable children: Secondary | ICD-10-CM

## 2013-09-05 DIAGNOSIS — D689 Coagulation defect, unspecified: Secondary | ICD-10-CM | POA: Diagnosis present

## 2013-09-05 DIAGNOSIS — O9903 Anemia complicating the puerperium: Secondary | ICD-10-CM | POA: Diagnosis not present

## 2013-09-05 DIAGNOSIS — O9912 Other diseases of the blood and blood-forming organs and certain disorders involving the immune mechanism complicating childbirth: Secondary | ICD-10-CM | POA: Diagnosis present

## 2013-09-05 LAB — CBC
HCT: 35.6 % — ABNORMAL LOW (ref 36.0–46.0)
Hemoglobin: 12.3 g/dL (ref 12.0–15.0)
MCH: 30.2 pg (ref 26.0–34.0)
MCHC: 34.6 g/dL (ref 30.0–36.0)
MCV: 87.5 fL (ref 78.0–100.0)
Platelets: 153 10*3/uL (ref 150–400)
RBC: 4.07 MIL/uL (ref 3.87–5.11)
RDW: 13.5 % (ref 11.5–15.5)
WBC: 8.7 10*3/uL (ref 4.0–10.5)

## 2013-09-05 LAB — TYPE AND SCREEN
ABO/RH(D): A POS
Antibody Screen: NEGATIVE

## 2013-09-05 LAB — AMNISURE RUPTURE OF MEMBRANE (ROM) NOT AT ARMC: AMNISURE: POSITIVE

## 2013-09-05 LAB — RPR: RPR Ser Ql: NONREACTIVE

## 2013-09-05 SURGERY — Surgical Case
Anesthesia: Spinal | Site: Abdomen

## 2013-09-05 MED ORDER — PHENYLEPHRINE HCL 10 MG/ML IJ SOLN
INTRAMUSCULAR | Status: AC
  Filled 2013-09-05: qty 1

## 2013-09-05 MED ORDER — OXYTOCIN 40 UNITS IN LACTATED RINGERS INFUSION - SIMPLE MED: 62.5000 mL/h | INTRAVENOUS | Status: AC

## 2013-09-05 MED ORDER — SCOPOLAMINE 1 MG/3DAYS TD PT72
1.0000 | MEDICATED_PATCH | Freq: Once | TRANSDERMAL | Status: AC
  Administered 2013-09-05: 1.5 mg via TRANSDERMAL

## 2013-09-05 MED ORDER — MORPHINE SULFATE 0.5 MG/ML IJ SOLN
INTRAMUSCULAR | Status: AC
  Filled 2013-09-05: qty 10

## 2013-09-05 MED ORDER — BUPIVACAINE IN DEXTROSE 0.75-8.25 % IT SOLN
INTRATHECAL | Status: DC | PRN
  Administered 2013-09-05: 1.6 mL via INTRATHECAL

## 2013-09-05 MED ORDER — LANOLIN HYDROUS EX OINT: 1.0000 "application " | TOPICAL_OINTMENT | CUTANEOUS | Status: DC | PRN

## 2013-09-05 MED ORDER — ZOLPIDEM TARTRATE 5 MG PO TABS: 5.0000 mg | ORAL_TABLET | Freq: Every evening | ORAL | Status: DC | PRN

## 2013-09-05 MED ORDER — SIMETHICONE 80 MG PO CHEW: 80.0000 mg | CHEWABLE_TABLET | ORAL | Status: DC | PRN

## 2013-09-05 MED ORDER — CEFAZOLIN SODIUM-DEXTROSE 2-3 GM-% IV SOLR: 2.0000 g | INTRAVENOUS | Status: DC

## 2013-09-05 MED ORDER — MEPERIDINE HCL 25 MG/ML IJ SOLN
INTRAMUSCULAR | Status: AC
  Filled 2013-09-05: qty 1

## 2013-09-05 MED ORDER — METHYLERGONOVINE MALEATE 0.2 MG PO TABS: 0.2000 mg | ORAL_TABLET | ORAL | Status: DC | PRN

## 2013-09-05 MED ORDER — ONDANSETRON HCL 4 MG/2ML IJ SOLN
INTRAMUSCULAR | Status: AC
  Filled 2013-09-05: qty 2

## 2013-09-05 MED ORDER — METHYLERGONOVINE MALEATE 0.2 MG/ML IJ SOLN: 0.2000 mg | INTRAMUSCULAR | Status: DC | PRN

## 2013-09-05 MED ORDER — FENTANYL CITRATE 0.05 MG/ML IJ SOLN: 25.0000 ug | INTRAMUSCULAR | Status: DC | PRN

## 2013-09-05 MED ORDER — LACTATED RINGERS IV SOLN
INTRAVENOUS | Status: DC
  Administered 2013-09-05: 15:00:00 via INTRAVENOUS

## 2013-09-05 MED ORDER — SENNOSIDES-DOCUSATE SODIUM 8.6-50 MG PO TABS
2.0000 | ORAL_TABLET | ORAL | Status: DC
  Administered 2013-09-05 – 2013-09-07 (×3): 2 via ORAL
  Filled 2013-09-05 (×3): qty 2

## 2013-09-05 MED ORDER — DIBUCAINE 1 % RE OINT: 1.0000 "application " | TOPICAL_OINTMENT | RECTAL | Status: DC | PRN

## 2013-09-05 MED ORDER — NALOXONE HCL 1 MG/ML IJ SOLN
1.0000 ug/kg/h | INTRAVENOUS | Status: DC | PRN
  Filled 2013-09-05: qty 2

## 2013-09-05 MED ORDER — SIMETHICONE 80 MG PO CHEW
80.0000 mg | CHEWABLE_TABLET | ORAL | Status: DC
  Administered 2013-09-06 – 2013-09-07 (×3): 80 mg via ORAL
  Filled 2013-09-05 (×3): qty 1

## 2013-09-05 MED ORDER — MORPHINE SULFATE (PF) 0.5 MG/ML IJ SOLN
INTRAMUSCULAR | Status: DC | PRN
  Administered 2013-09-05: .2 mg via INTRATHECAL

## 2013-09-05 MED ORDER — NALBUPHINE HCL 10 MG/ML IJ SOLN
5.0000 mg | INTRAMUSCULAR | Status: DC | PRN
  Administered 2013-09-05 – 2013-09-06 (×2): 10 mg via INTRAVENOUS
  Filled 2013-09-05 (×2): qty 1

## 2013-09-05 MED ORDER — PRENATAL MULTIVITAMIN CH
1.0000 | ORAL_TABLET | Freq: Every day | ORAL | Status: DC
Start: 2013-09-05 — End: 2013-09-08
  Administered 2013-09-05 – 2013-09-07 (×3): 1 via ORAL
  Filled 2013-09-05 (×3): qty 1

## 2013-09-05 MED ORDER — SCOPOLAMINE 1 MG/3DAYS TD PT72
MEDICATED_PATCH | TRANSDERMAL | Status: AC
  Administered 2013-09-05: 1.5 mg via TRANSDERMAL
  Filled 2013-09-05: qty 1

## 2013-09-05 MED ORDER — ONDANSETRON HCL 4 MG PO TABS: 4.0000 mg | ORAL_TABLET | ORAL | Status: DC | PRN

## 2013-09-05 MED ORDER — KETOROLAC TROMETHAMINE 30 MG/ML IJ SOLN
INTRAMUSCULAR | Status: AC
  Administered 2013-09-05: 30 mg via INTRAMUSCULAR
  Filled 2013-09-05: qty 1

## 2013-09-05 MED ORDER — MENTHOL 3 MG MT LOZG: 1.0000 | LOZENGE | OROMUCOSAL | Status: DC | PRN

## 2013-09-05 MED ORDER — ONDANSETRON HCL 4 MG/2ML IJ SOLN
INTRAMUSCULAR | Status: DC | PRN
  Administered 2013-09-05: 4 mg via INTRAVENOUS

## 2013-09-05 MED ORDER — FAMOTIDINE IN NACL 20-0.9 MG/50ML-% IV SOLN
20.0000 mg | Freq: Once | INTRAVENOUS | Status: DC
  Filled 2013-09-05: qty 50

## 2013-09-05 MED ORDER — KETOROLAC TROMETHAMINE 30 MG/ML IJ SOLN: 30.0000 mg | Freq: Four times a day (QID) | INTRAMUSCULAR | Status: AC | PRN

## 2013-09-05 MED ORDER — KETOROLAC TROMETHAMINE 30 MG/ML IJ SOLN
30.0000 mg | Freq: Four times a day (QID) | INTRAMUSCULAR | Status: AC | PRN
  Administered 2013-09-05: 30 mg via INTRAMUSCULAR

## 2013-09-05 MED ORDER — SODIUM CHLORIDE 0.9 % IJ SOLN: 3.0000 mL | INTRAMUSCULAR | Status: DC | PRN

## 2013-09-05 MED ORDER — NALBUPHINE HCL 10 MG/ML IJ SOLN: 5.0000 mg | INTRAMUSCULAR | Status: DC | PRN

## 2013-09-05 MED ORDER — EPHEDRINE 5 MG/ML INJ
INTRAVENOUS | Status: AC
  Filled 2013-09-05: qty 10

## 2013-09-05 MED ORDER — EPHEDRINE SULFATE 50 MG/ML IJ SOLN
INTRAMUSCULAR | Status: DC | PRN
  Administered 2013-09-05: 10 mg via INTRAVENOUS

## 2013-09-05 MED ORDER — LACTATED RINGERS IV SOLN
INTRAVENOUS | Status: DC
  Administered 2013-09-05 (×3): via INTRAVENOUS

## 2013-09-05 MED ORDER — FENTANYL CITRATE 0.05 MG/ML IJ SOLN
INTRAMUSCULAR | Status: AC
  Filled 2013-09-05: qty 2

## 2013-09-05 MED ORDER — OXYTOCIN 10 UNIT/ML IJ SOLN
40.0000 [IU] | INTRAVENOUS | Status: DC | PRN
  Administered 2013-09-05: 40 [IU] via INTRAVENOUS

## 2013-09-05 MED ORDER — SIMETHICONE 80 MG PO CHEW
80.0000 mg | CHEWABLE_TABLET | Freq: Three times a day (TID) | ORAL | Status: DC
  Administered 2013-09-05 – 2013-09-08 (×9): 80 mg via ORAL
  Filled 2013-09-05 (×9): qty 1

## 2013-09-05 MED ORDER — WITCH HAZEL-GLYCERIN EX PADS: 1.0000 "application " | MEDICATED_PAD | CUTANEOUS | Status: DC | PRN

## 2013-09-05 MED ORDER — MEPERIDINE HCL 25 MG/ML IJ SOLN
INTRAMUSCULAR | Status: DC | PRN
  Administered 2013-09-05 (×2): 12.5 mg via INTRAVENOUS

## 2013-09-05 MED ORDER — METOCLOPRAMIDE HCL 5 MG/ML IJ SOLN: 10.0000 mg | Freq: Three times a day (TID) | INTRAMUSCULAR | Status: DC | PRN

## 2013-09-05 MED ORDER — DIPHENHYDRAMINE HCL 25 MG PO CAPS: 25.0000 mg | ORAL_CAPSULE | Freq: Four times a day (QID) | ORAL | Status: DC | PRN

## 2013-09-05 MED ORDER — SODIUM CHLORIDE 0.9 % IV SOLN
2.0000 g | Freq: Once | INTRAVENOUS | Status: AC
  Administered 2013-09-05: 2 g via INTRAVENOUS
  Filled 2013-09-05: qty 2000

## 2013-09-05 MED ORDER — ATROPINE SULFATE 0.4 MG/ML IJ SOLN
INTRAMUSCULAR | Status: DC | PRN
Start: 2013-09-05 — End: 2013-09-05
  Administered 2013-09-05 (×2): .1 mg via INTRAVENOUS

## 2013-09-05 MED ORDER — DIPHENHYDRAMINE HCL 50 MG/ML IJ SOLN: 12.5000 mg | INTRAMUSCULAR | Status: DC | PRN

## 2013-09-05 MED ORDER — DIPHENHYDRAMINE HCL 50 MG/ML IJ SOLN: 25.0000 mg | INTRAMUSCULAR | Status: DC | PRN

## 2013-09-05 MED ORDER — PHENYLEPHRINE 8 MG IN D5W 100 ML (0.08MG/ML) PREMIX OPTIME
INJECTION | INTRAVENOUS | Status: DC | PRN
  Administered 2013-09-05: 60 ug/min via INTRAVENOUS

## 2013-09-05 MED ORDER — ONDANSETRON HCL 4 MG/2ML IJ SOLN: 4.0000 mg | Freq: Three times a day (TID) | INTRAMUSCULAR | Status: DC | PRN

## 2013-09-05 MED ORDER — OXYCODONE-ACETAMINOPHEN 5-325 MG PO TABS
1.0000 | ORAL_TABLET | ORAL | Status: DC | PRN
  Administered 2013-09-06 – 2013-09-07 (×7): 1 via ORAL
  Administered 2013-09-07 (×2): 2 via ORAL
  Administered 2013-09-08: 1 via ORAL
  Filled 2013-09-05 (×3): qty 1
  Filled 2013-09-05: qty 2
  Filled 2013-09-05 (×4): qty 1
  Filled 2013-09-05 (×2): qty 2

## 2013-09-05 MED ORDER — NALOXONE HCL 0.4 MG/ML IJ SOLN: 0.4000 mg | INTRAMUSCULAR | Status: DC | PRN

## 2013-09-05 MED ORDER — CITRIC ACID-SODIUM CITRATE 334-500 MG/5ML PO SOLN
30.0000 mL | Freq: Once | ORAL | Status: AC
  Administered 2013-09-05: 30 mL via ORAL
  Filled 2013-09-05: qty 15

## 2013-09-05 MED ORDER — DIPHENHYDRAMINE HCL 25 MG PO CAPS
25.0000 mg | ORAL_CAPSULE | ORAL | Status: DC | PRN
  Administered 2013-09-05: 25 mg via ORAL
  Filled 2013-09-05 (×2): qty 1

## 2013-09-05 MED ORDER — MEPERIDINE HCL 25 MG/ML IJ SOLN: 6.2500 mg | INTRAMUSCULAR | Status: DC | PRN

## 2013-09-05 MED ORDER — OXYTOCIN 10 UNIT/ML IJ SOLN
INTRAMUSCULAR | Status: AC
  Filled 2013-09-05: qty 4

## 2013-09-05 MED ORDER — BUPIVACAINE HCL (PF) 0.25 % IJ SOLN
INTRAMUSCULAR | Status: DC | PRN
  Administered 2013-09-05: 10 mL

## 2013-09-05 MED ORDER — IBUPROFEN 600 MG PO TABS
600.0000 mg | ORAL_TABLET | Freq: Four times a day (QID) | ORAL | Status: DC
  Administered 2013-09-05 – 2013-09-08 (×11): 600 mg via ORAL
  Filled 2013-09-05 (×11): qty 1

## 2013-09-05 MED ORDER — TETANUS-DIPHTH-ACELL PERTUSSIS 5-2.5-18.5 LF-MCG/0.5 IM SUSP: 0.5000 mL | Freq: Once | INTRAMUSCULAR | Status: DC

## 2013-09-05 MED ORDER — ONDANSETRON HCL 4 MG/2ML IJ SOLN: 4.0000 mg | INTRAMUSCULAR | Status: DC | PRN

## 2013-09-05 SURGICAL SUPPLY — 36 items
ADH SKN CLS APL DERMABOND .7 (GAUZE/BANDAGES/DRESSINGS) ×2
CLAMP CORD UMBIL (MISCELLANEOUS) IMPLANT
CLOTH BEACON ORANGE TIMEOUT ST (SAFETY) ×3 IMPLANT
CONTAINER PREFILL 10% NBF 15ML (MISCELLANEOUS) IMPLANT
DERMABOND ADVANCED (GAUZE/BANDAGES/DRESSINGS) ×1
DERMABOND ADVANCED .7 DNX12 (GAUZE/BANDAGES/DRESSINGS) ×1 IMPLANT
DRAPE LG THREE QUARTER DISP (DRAPES) IMPLANT
DRSG OPSITE POSTOP 4X10 (GAUZE/BANDAGES/DRESSINGS) ×3 IMPLANT
DURAPREP 26ML APPLICATOR (WOUND CARE) ×3 IMPLANT
ELECT REM PT RETURN 9FT ADLT (ELECTROSURGICAL) ×3
ELECTRODE REM PT RTRN 9FT ADLT (ELECTROSURGICAL) ×2 IMPLANT
EXTRACTOR VACUUM M CUP 4 TUBE (SUCTIONS) IMPLANT
GLOVE BIO SURGEON STRL SZ7.5 (GLOVE) ×3 IMPLANT
GOWN PREVENTION PLUS XLARGE (GOWN DISPOSABLE) ×3 IMPLANT
GOWN STRL REIN XL XLG (GOWN DISPOSABLE) ×3 IMPLANT
KIT ABG SYR 3ML LUER SLIP (SYRINGE) IMPLANT
NDL HYPO 25X1 1.5 SAFETY (NEEDLE) ×1 IMPLANT
NDL HYPO 25X5/8 SAFETYGLIDE (NEEDLE) IMPLANT
NEEDLE HYPO 25X1 1.5 SAFETY (NEEDLE) ×3 IMPLANT
NEEDLE HYPO 25X5/8 SAFETYGLIDE (NEEDLE) IMPLANT
NS IRRIG 1000ML POUR BTL (IV SOLUTION) ×3 IMPLANT
PACK C SECTION WH (CUSTOM PROCEDURE TRAY) ×3 IMPLANT
STAPLER VISISTAT 35W (STAPLE) IMPLANT
SUT MNCRL 0 VIOLET CTX 36 (SUTURE) ×4 IMPLANT
SUT MNCRL AB 3-0 PS2 27 (SUTURE) IMPLANT
SUT MON AB 2-0 CT1 27 (SUTURE) ×3 IMPLANT
SUT MON AB-0 CT1 36 (SUTURE) ×6 IMPLANT
SUT MONOCRYL 0 CTX 36 (SUTURE) ×2
SUT PLAIN 0 NONE (SUTURE) ×2 IMPLANT
SUT PLAIN 2 0 (SUTURE)
SUT PLAIN 2 0 XLH (SUTURE) IMPLANT
SUT PLAIN ABS 2-0 CT1 27XMFL (SUTURE) IMPLANT
SYR CONTROL 10ML LL (SYRINGE) ×3 IMPLANT
TOWEL OR 17X24 6PK STRL BLUE (TOWEL DISPOSABLE) ×3 IMPLANT
TRAY FOLEY CATH 14FR (SET/KITS/TRAYS/PACK) ×3 IMPLANT
WATER STERILE IRR 1000ML POUR (IV SOLUTION) ×1 IMPLANT

## 2013-09-05 NOTE — Op Note (Signed)
Cesarean Section Procedure Note  Indications: previous uterine incision Kerr x one Desires TL  Pre-operative Diagnosis: 38 week 4 day pregnancy.  Post-operative Diagnosis: same  Surgeon: Lovenia Kim   Assistants: none  Anesthesia: Local anesthesia 0.25.% bupivacaine and Spinal anesthesia  ASA Class: 2  Procedure Details  The patient was seen in the Holding Room. The risks, benefits, complications, treatment options, and expected outcomes were discussed with the patient.  The patient concurred with the proposed plan, giving informed consent. The risks of anesthesia, infection, bleeding and possible injury to other organs discussed. Injury to bowel, bladder, or ureter with possible need for repair discussed. Possible need for transfusion with secondary risks of hepatitis or HIV acquisition discussed. Post operative complications to include but not limited to DVT, PE and Pneumonia noted. The site of surgery properly noted/marked. The patient was taken to Operating Room # 1, identified as Karen Pruitt and the procedure verified as C-Section Delivery. A Time Out was held and the above information confirmed.  After induction of anesthesia, the patient was draped and prepped in the usual sterile manner. A Pfannenstiel incision was made and carried down through the subcutaneous tissue to the fascia. Fascial incision was made and extended transversely using Mayo scissors. The fascia was separated from the underlying rectus tissue superiorly and inferiorly. The peritoneum was identified and entered. Peritoneal incision was extended longitudinally. The utero-vesical peritoneal reflection was incised transversely and the bladder flap was bluntly freed from the lower uterine segment. A low transverse uterine incision(Kerr hysterotomy) was made. Delivered from vertex presentation was a  female with Apgar scores of 8 at one minute and 9 at five minutes. Bulb suctioning gently performed. Neonatal team in  attendance.After the umbilical cord was clamped and cut cord blood was obtained for evaluation. The placenta was removed intact and appeared normal. The uterus was curetted with a dry lap pack. Good hemostasis was noted.The uterine outline, tubes and ovaries appeared normal. The uterine incision was closed with running locked sutures of 0 Monocryl x 1 layer. Hemostasis was observed. Both fallopian tubes interrupted in a modified Pomeroy fashion. Tubal segments to pathology. Lavage was carried out until clear.The parietal peritoneum was closed with a running 2-0 Monocryl suture. The fascia was then reapproximated with running sutures of 0 Monocryl. The skin was reapproximated with 3-0 monocryl  After 2-0 plain to close [place] layer.  Instrument, sponge, and needle counts were correct prior the abdominal closure and at the conclusion of the case.   Findings: As noted above  Estimated Blood Loss:  300 mL         Drains: foley                 Specimens: placenta and bilateral tubal segments.                 Complications:  None; patient tolerated the procedure well.         Disposition: PACU - hemodynamically stable.         Condition: stable  Attending Attestation: I performed the procedure.

## 2013-09-05 NOTE — Anesthesia Preprocedure Evaluation (Signed)
Anesthesia Evaluation  Patient identified by MRN, date of birth, ID band Patient awake    Reviewed: Allergy & Precautions, H&P , Patient's Chart, lab work & pertinent test results  History of Anesthesia Complications (+) PONV  Airway Mallampati: II TM Distance: >3 FB Neck ROM: full    Dental no notable dental hx.    Pulmonary  breath sounds clear to auscultation  Pulmonary exam normal       Cardiovascular Exercise Tolerance: Good Rhythm:regular Rate:Normal     Neuro/Psych    GI/Hepatic   Endo/Other    Renal/GU      Musculoskeletal   Abdominal   Peds  Hematology   Anesthesia Other Findings   Reproductive/Obstetrics                           Anesthesia Physical Anesthesia Plan  ASA: II  Anesthesia Plan: Spinal   Post-op Pain Management:    Induction:   Airway Management Planned:   Additional Equipment:   Intra-op Plan:   Post-operative Plan:   Informed Consent: I have reviewed the patients History and Physical, chart, labs and discussed the procedure including the risks, benefits and alternatives for the proposed anesthesia with the patient or authorized representative who has indicated his/her understanding and acceptance.   Dental Advisory Given  Plan Discussed with: CRNA  Anesthesia Plan Comments: (Lab work confirmed with CRNA in room. Platelets okay. Discussed spinal anesthetic, and patient consents to the procedure:  included risk of possible headache,backache, failed block, allergic reaction, and nerve injury. This patient was asked if she had any questions or concerns before the procedure started. )        Anesthesia Quick Evaluation

## 2013-09-05 NOTE — MAU Note (Signed)
Dr. Ronita Hipps returned page. New orders received: to give 2 grams IV ampicillin x one dose as a prophaylaxis. CBC and type and screen also ordered. To call back once all labs are resulted and IV is complete. No other orders at this time.

## 2013-09-05 NOTE — H&P (Signed)
Karen Pruitt is a 43 y.o. female presenting for ?SROM. Fern and nitrazine neg. Amnisure pos. Good FM. Rare contractions. Per RN refuses pelvic exam. Per RN last ate at [facility].  Maternal Medical History:  Reason for admission: Rupture of membranes.   Contractions: Onset was less than 1 hour ago.   Frequency: rare.   Perceived severity is mild.    Fetal activity: Perceived fetal activity is normal.   Last perceived fetal movement was within the past hour.    Prenatal complications: no prenatal complications Prenatal Complications - Diabetes: none.    OB History   Grav Para Term Preterm Abortions TAB SAB Ect Mult Living   2 1  1      1      Past Medical History  Diagnosis Date  . Interstitial cystitis   . Pelvic floor dysfunction   . Anxiety   . Depression   . S/P cesarean section - elective primary 12/8 06/20/2012  . Postpartum care following cesarean delivery 06/20/2012  . Maternal anemia complicating pregnancy, childbirth, or the puerperium 06/20/2012    Mild IDA w/ superimposed ABL anemia  . Anxiety and depression 06/20/2012   Past Surgical History  Procedure Laterality Date  . Toe surgery    . Cystoscopy with hydrodistension and biopsy    . Cesarean section  06/19/2012    Procedure: CESAREAN SECTION;  Surgeon: Claiborne Billings A. Pamala Hurry, MD;  Location: [facility];  Service: Obstetrics;  Laterality: N/A;  Primary Cesarean Section    Family History: family history is negative for Other. Social History:  reports that she has never smoked. She does not have any smokeless tobacco history on file. She reports that she does not drink alcohol or use illicit drugs.   Prenatal Transfer Tool  Maternal Diabetes: No Genetic Screening: Normal Maternal Ultrasounds/Referrals: Normal Fetal Ultrasounds or other Referrals:  None Maternal Substance Abuse:  No Significant Maternal Medications:  None Significant Maternal Lab Results:  None Other Comments:  GBS positive- ? Previous infant  infected?  Review of Systems  All other systems reviewed and are negative.      Blood pressure 114/74, pulse 77, temperature 97.9 F (36.6 C), temperature source Oral, resp. rate 18. Maternal Exam:  Uterine Assessment: Contraction strength is mild.  Contraction frequency is rare.   Abdomen: Patient reports no abdominal tenderness. Surgical scars: low transverse.   Fetal presentation: vertex  Introitus: Normal vulva. Normal vagina.  Pelvis: of concern for delivery.   Cervix: Cervix evaluated by digital exam.     Physical Exam  Nursing note and vitals reviewed. Constitutional: She is oriented to person, place, and time. She appears well-developed and well-nourished.  HENT:  Head: Normocephalic and atraumatic.  Eyes: Pupils are equal, round, and reactive to light.  Neck: Normal range of motion.  Cardiovascular: Normal rate and regular rhythm.   Respiratory: Effort normal and breath sounds normal.  GI: Soft.  Genitourinary: Vagina normal and uterus normal.  Neurological: She is alert and oriented to person, place, and time.  Skin: Skin is warm and dry.  Psychiatric: She has a normal mood and affect.    Prenatal labs: see prenatal ABO, Rh:   Antibody:   Rubella:   RPR:    HBsAg:    HIV:    GBS:     Assessment/Plan: Term IUP with SROM GBS positive with ? Previously infected neonate Previous Csec for pelvic floor dysfunction Declines VBAC  Will administer IV Ampicillin per pt request and proceed with rpt csec. Consent  done. Risks vs benefits discussed. [name] 09/05/2013, 6:54 AM

## 2013-09-05 NOTE — Transfer of Care (Signed)
Immediate Anesthesia Transfer of Care Note  Patient: Karen Pruitt  Procedure(s) Performed: Procedure(s): CESAREAN SECTION WITH BILATERAL TUBAL LIGATION (N/A)  Patient Location: PACU  Anesthesia Type:Spinal  Level of Consciousness: awake, alert , oriented and patient cooperative  Airway & Oxygen Therapy: Patient Spontanous Breathing  Post-op Assessment: Report given to PACU RN and Post -op Vital signs reviewed and stable  Post vital signs: Reviewed and stable  Complications: No apparent anesthesia complications

## 2013-09-05 NOTE — Anesthesia Procedure Notes (Signed)
Spinal  Patient location during procedure: OB Start time: 09/05/2013 8:43 AM End time: 09/05/2013 8:46 AM Staffing Anesthesiologist: Ermalene Postin, CHRIS Preanesthetic Checklist Completed: patient identified, surgical consent, pre-op evaluation, timeout performed, IV checked, risks and benefits discussed and monitors and equipment checked Spinal Block Patient position: sitting Prep: site prepped and draped and DuraPrep Patient monitoring: heart rate, cardiac monitor, continuous pulse ox and blood pressure Approach: midline Location: L3-4 Injection technique: single-shot Needle Needle type: Pencan  Needle gauge: 24 G Needle length: 10 cm Assessment Sensory level: T4 Additional Notes H+P and labs checked, risks and benefits discussed with the patient, consent obtained, procedure tolerated well and without complications.

## 2013-09-05 NOTE — MAU Note (Signed)
Nursery called, to give heads up regarding c/s

## 2013-09-05 NOTE — Progress Notes (Signed)
Dr. Ronita Hipps notified of patient presentation & amnisure order received by Sunday Corn CNM. Also notified of fetal HR deceleration. Will continue to monitor patient and call Dr. Ronita Hipps back with [facility] result.

## 2013-09-05 NOTE — Anesthesia Postprocedure Evaluation (Signed)
  Anesthesia Post-op Note  Patient: Karen Pruitt  Procedure(s) Performed: Procedure(s): CESAREAN SECTION WITH BILATERAL TUBAL LIGATION (N/A)  Patient is awake, responsive, moving her legs, and has signs of resolution of her numbness. Pain and nausea are reasonably well controlled. Vital signs are stable and clinically acceptable. Oxygen saturation is clinically acceptable. There are no apparent anesthetic complications at this time. Patient is ready for discharge.

## 2013-09-05 NOTE — Progress Notes (Signed)
Patient ID: Karen Pruitt, female   DOB: 1971/03/27, 43 y.o.   MRN: [id number] Patient seen and examined. Consent witnessed and signed. No changes noted. Update completed. CBC    Component Value Date/Time   WBC 8.7 09/05/2013 0652   RBC 4.07 09/05/2013 0652   HGB 12.3 09/05/2013 0652   HCT 35.6* 09/05/2013 0652   PLT 153 09/05/2013 0652   MCV 87.5 09/05/2013 0652   MCH 30.2 09/05/2013 0652   MCHC 34.6 09/05/2013 0652   RDW 13.5 09/05/2013 1537

## 2013-09-05 NOTE — MAU Note (Signed)
Water broke around 0215am. Repeat c-section scheduled for Friday. States small amount of braxton hicks contractions on and off nothing regular. Denies any bleeding. States a large amount of clear fluid leaking out. Baby is moving well.

## 2013-09-06 ENCOUNTER — Encounter (HOSPITAL_COMMUNITY): Payer: Self-pay | Admitting: Obstetrics and Gynecology

## 2013-09-06 LAB — CBC
HCT: 31.2 % — ABNORMAL LOW (ref 36.0–46.0)
HEMOGLOBIN: 10.5 g/dL — AB (ref 12.0–15.0)
MCH: 30.1 pg (ref 26.0–34.0)
MCHC: 33.7 g/dL (ref 30.0–36.0)
MCV: 89.4 fL (ref 78.0–100.0)
Platelets: 130 10*3/uL — ABNORMAL LOW (ref 150–400)
RBC: 3.49 MIL/uL — ABNORMAL LOW (ref 3.87–5.11)
RDW: 13.9 % (ref 11.5–15.5)
WBC: 10.2 10*3/uL (ref 4.0–10.5)

## 2013-09-06 LAB — BIRTH TISSUE RECOVERY COLLECTION (PLACENTA DONATION)

## 2013-09-06 NOTE — Progress Notes (Signed)
Patient was referred for history of depression/anxiety.  * Referral screened out by Clinical Social Worker because none of the following criteria appear to apply:  ~ History of anxiety/depression during this pregnancy, or of post-partum depression.  ~ Diagnosis of anxiety and/or depression within last 3 years  ~ History of depression due to pregnancy loss/loss of child  OR  * Patient's symptoms currently being treated with medication and/or therapy.  Please contact the Clinical Social Worker if needs arise, or by the patient's request.  Pt has history of "little" anxiety however states she is able to manage symptoms well.

## 2013-09-06 NOTE — Anesthesia Postprocedure Evaluation (Signed)
Anesthesia Post Note  Patient: Karen Pruitt  Procedure(s) Performed: Procedure(s) (LRB): CESAREAN SECTION WITH BILATERAL TUBAL LIGATION (N/A)  Anesthesia type:SAB  Patient location: Mother/Baby  Post pain: Pain level controlled  Post assessment: Post-op Vital signs reviewed  Last Vitals:  Filed Vitals:   09/06/13 0630  BP: 97/60  Pulse: 51  Temp: 36.9 C  Resp: 16    Post vital signs: Reviewed  Level of consciousness: awake  Complications: No apparent anesthesia complications

## 2013-09-06 NOTE — Lactation Note (Signed)
This note was copied from the chart of Karen Pruitt. Lactation Consultation Note  Patient Name: Karen Oneda Duffett IYMEB'[id number] Date: 09/06/2013 Reason for consult: Follow-up assessment Mom reports baby is latching well. She reports to [facility] she plans to BF for few days for baby to get colostrum, then probably switch to formula. Did not have good breast feeding experience with 1st child. Mom reports her breasts are becoming more firm. No engorgement present. Reviewed with Mom that breast and bottle feeding early can cause engorgement. Advised to pay attention to how breast feel. BF before giving supplements. Discussed how to dry milk up when she stops putting baby to breast, but encouraged Mom that since this baby is latching better, this may be a more positive BF experience if she gives it a chance. Mom will consider. Advised to ask for assist if desired.   Maternal Data    Feeding Feeding Type: Bottle Fed - Formula  LATCH Score/Interventions                      Lactation Tools Discussed/Used     Consult Status Consult Status: PRN    Katrine Coho 09/06/2013, 3:52 PM

## 2013-09-06 NOTE — Addendum Note (Signed)
Addendum created 09/06/13 0757 by Talbot Grumbling, CRNA   Modules edited: Notes Section   Notes Section:  File: [id number]

## 2013-09-06 NOTE — Progress Notes (Signed)
Patient ID: Karen Pruitt, female   DOB: 07-26-1970, 43 y.o.   MRN: [id number] POD # 1  Subjective: Pt reports feeling well, but sore / Pain controlled with ibuprofen and percocet Tolerating po/ Foley d/c'ed approx 1.5 hrs ago.  Has not voided yet/ No n/v/Flatus neg Activity: up with assistance Bleeding is light Newborn info:  Information for the patient's newborn:  Nature, Kueker Girl Raidyn [[id number]]  female Feeding: breast and bottle   Objective: VS: Blood pressure 97/60, pulse 51, temperature 98.4 F (36.9 C), temperature source Oral, resp. rate 16.    Intake/Output Summary (Last 24 hours) at 09/06/13 0817 Last data filed at 09/06/13 0743  Gross per 24 hour  Intake 4242.92 ml  Output   3310 ml  Net 932.92 ml      Recent Labs  09/05/13 0652 09/06/13 0605  WBC 8.7 10.2  HGB 12.3 10.5*  HCT 35.6* 31.2*  PLT 153 130*    Blood type: A POS Rubella:   Immune   Physical Exam:  General: alert, cooperative and no distress CV: Regular rate and rhythm Resp: clear Abdomen: soft, nontender, normal bowel sounds Incision: Covered with Tegaderm and honeycomb dressing; well approximated. Uterine Fundus: firm, below umbilicus, nontender Lochia: minimal Ext: Homans sign is negative, no sign of DVT and no edema, redness or tenderness in the calves or thighs    A/P: POD # 1/ [id number] S/P repeat C/Section with BTL @ 38wks; SROM Doing well Continue routine post op orders   Signed: Darleen Crocker, MSN, [facility] 09/06/2013, 8:17 AM

## 2013-09-07 ENCOUNTER — Inpatient Hospital Stay (HOSPITAL_COMMUNITY): Admission: RE | Admit: 2013-09-07 | Payer: BC Managed Care – PPO | Source: Ambulatory Visit

## 2013-09-07 MED ORDER — POLYSACCHARIDE IRON COMPLEX 150 MG PO CAPS
150.0000 mg | ORAL_CAPSULE | Freq: Two times a day (BID) | ORAL | Status: DC
  Administered 2013-09-07 – 2013-09-08 (×3): 150 mg via ORAL
  Filled 2013-09-07 (×3): qty 1

## 2013-09-07 NOTE — Lactation Note (Signed)
This note was copied from the chart of Karen Pruitt. Lactation Consultation Note: Follow up visit with mom. She reports that baby is latching well but she is giving formula after each feeding. Only plans to nurse for a few days- reports that 3 days of Colostrum is good for her. Breasts are feeling much fuller today. Ice packs given for comfort. Reviewed engorgement prevention and treatment. No questions at present. To call for assist prn  Patient Name: Karen Pruitt GSUPJ'S Date: 09/07/2013 Reason for consult: Follow-up assessment   Maternal Data Formula Feeding for Exclusion: Yes Reason for exclusion: Mother's choice to formula and breast feed on admission  Feeding    LATCH Score/Interventions          Comfort (Breast/Nipple): Filling, red/small blisters or bruises, mild/mod discomfort  Problem noted: Mild/Moderate discomfort        Lactation Tools Discussed/Used     Consult Status Consult Status: Complete    Truddie Crumble 09/07/2013, 2:45 PM

## 2013-09-07 NOTE — Progress Notes (Addendum)
POD # 2  Subjective: Pt reports feeling well/ Pain controlled with Motrin and Percocet Tolerating po/Voiding without problems/ No n/v/ Flatus present Activity: ad lib Bleeding is light Newborn info:  Information for the patient's newborn:  Negin, Hegg Girl Helia [[id number]]  female Feeding: breast and bottle   Objective: VS:  Filed Vitals:   09/06/13 0630 09/06/13 1116 09/06/13 1804 09/07/13 0600  BP: 97/60 109/66 117/65 102/65  Pulse: 51 56 65 53  Temp: 98.4 F (36.9 C) 98.6 F (37 C) 97.2 F (36.2 C) 98 F (36.7 C)  TempSrc: Oral Oral Oral   Resp: 16 16 18 16   Height:      Weight:      SpO2: 100% 100%      I&O: Intake/Output     02/25 0701 - 02/26 0700 02/26 0701 - 02/27 0700   P.O.     I.V. (mL/kg)     Total Intake(mL/kg)     Urine (mL/kg/hr) 1000 (0.7)    Blood     Total Output 1000     Net -1000            LABS:  Recent Labs  09/05/13 0652 09/06/13 0605  WBC 8.7 10.2  HGB 12.3 10.5*  HCT 35.6* 31.2*  PLT 153 130*    Blood type: --/--/A POS (02/24 5093) Rubella:       Physical Exam:  General: alert and cooperative CV: Regular rate and rhythm Resp: CTA bilaterally Abdomen: soft, nontender, normal bowel sounds Uterine Fundus: firm, below umbilicus, nontender Incision: Covered with Tegaderm and honeycomb dressing; well approximated. Lochia: minimal Ext: extremities normal, atraumatic, no cyanosis or edema and Homans sign is negative, no sign of DVT    Assessment/: POD # 2/ G2P1102/ S/P C/Section d/t repeat Mild ABL anemia Thrombocytopenia-stable Doing well  Plan: Continue routine post op orders Anticipate discharge home tomorrow   Signed: Graciela Husbands, MSN, CNM 09/07/2013, 10:57 AM

## 2013-09-08 ENCOUNTER — Inpatient Hospital Stay (HOSPITAL_COMMUNITY): Admission: RE | Admit: 2013-09-08 | Payer: BC Managed Care – PPO | Source: Ambulatory Visit | Admitting: Obstetrics

## 2013-09-08 ENCOUNTER — Encounter (HOSPITAL_COMMUNITY): Admission: RE | Payer: Self-pay | Source: Ambulatory Visit

## 2013-09-08 SURGERY — Surgical Case
Anesthesia: Spinal | Laterality: Bilateral

## 2013-09-08 MED ORDER — OXYCODONE-ACETAMINOPHEN 5-325 MG PO TABS: 1.0000 | ORAL_TABLET | ORAL | Status: DC | PRN

## 2013-09-08 MED ORDER — SENNOSIDES-DOCUSATE SODIUM 8.6-50 MG PO TABS: 2.0000 | ORAL_TABLET | Freq: Every evening | ORAL | Status: DC | PRN

## 2013-09-08 MED ORDER — IBUPROFEN 600 MG PO TABS: 600.0000 mg | ORAL_TABLET | Freq: Four times a day (QID) | ORAL | Status: DC | PRN

## 2013-09-08 NOTE — Progress Notes (Signed)
Patient ID: Karen Pruitt, female   DOB: [birth year], 43 y.o.   MRN: [id number] POD # 3  Subjective: Pt reports feeling well and desires d/c to home / Pain controlled with ibuprofen and percocet Tolerating po/Voiding without problems/ No n/v/Flatus pos Activity: out of bed and ambulate Bleeding is light Newborn info:  Information for the patient's newborn:  Adanya, Sosinski Girl Dama [[id number]]  female Feeding: breast   Objective: VS: Blood pressure 117/78, pulse 66, temperature 98.1 F (36.7 C), temperature source Oral, resp. rate 18.   LABS:  Recent Labs  09/06/13 0605  WBC 10.2  HGB 10.5*  PLT 130*                             Physical Exam:  General: alert, cooperative and no distress CV: Regular rate and rhythm Resp: clear Abdomen: soft, nontender, normal bowel sounds Incision: Covered with Tegaderm and honeycomb dressing; well approximated. Uterine Fundus: firm, below umbilicus, nontender Lochia: minimal Ext: Homans sign is negative, no sign of DVT and no edema, redness or tenderness in the calves or thighs    A/P: POD # 3/ G2P1102/ S/P Elective repeat C/Section d/t pelvic floor dysfunction Doing well and stable for discharge home RX's: Ibuprofen 600mg  po Q 6 hrs prn pain #30 Refill x 1 Percocet 5/325 1 - 2 tabs po every 6 hrs prn pain  #30 No refill Niferex 150mg  po QD/BID #30/#60 Refill x 1 Colace 100mg  po up to TID prn #30 Ref x 1 Rt pp visit in 6 wks    Signed: Darleen Crocker, MSN, [facility] 09/08/2013, 7:28 AM

## 2013-09-08 NOTE — Discharge Summary (Signed)
Obstetric Discharge Summary Reason for Admission: G2 P1 @ 38w 4d with SROM.  Desires repeat C/S d/t hx of pelvic floor dysfunction     GBS pos; ampicillin per protocol Prenatal Procedures: NST and ultrasound Intrapartum Procedures: cesarean: low cervical, transverse Postpartum Procedures: none Complications-Operative and Postpartum: none Hemoglobin  Date Value Ref Range Status  09/06/2013 10.5* 12.0 - 15.0 g/dL Final     HCT  Date Value Ref Range Status  09/06/2013 31.2* 36.0 - 46.0 % Final    Physical Exam:  General: alert, cooperative and no distress Lochia: appropriate Uterine Fundus: firm Incision: Covered with Tegaderm and honeycomb dressing; well approximated. Closed with subcuticular closure DVT Evaluation: No evidence of DVT seen on physical exam. Negative Homan's sign.  Discharge Diagnoses: G2 P2 s/p rpt C/S.  hx pelvic floor dysfunction  Discharge Information: Date: 09/08/2013 Activity: pelvic rest Diet: routine Medications: PNV, Colace and Percocet Condition: stable Instructions: refer to practice specific booklet Discharge to: home Follow-up Information   Follow up with [facility] MD In 6 weeks.   Specialty:  Obstetrics and Gynecology   Contact information:   [place] [address] [phone]       Newborn Data: Live born female on 09/05/13 Birth Weight: 5 lb 10.7 oz (2570 g) APGAR: 8, 9  Home with mother.  Karen Pruitt 09/08/2013, 8:06 AM

## 2014-05-14 ENCOUNTER — Encounter (HOSPITAL_COMMUNITY): Payer: Self-pay | Admitting: Obstetrics and Gynecology

## 2016-07-24 DIAGNOSIS — L245 Irritant contact dermatitis due to other chemical products: Secondary | ICD-10-CM | POA: Diagnosis not present

## 2016-07-24 DIAGNOSIS — L308 Other specified dermatitis: Secondary | ICD-10-CM | POA: Diagnosis not present

## 2016-07-24 DIAGNOSIS — L282 Other prurigo: Secondary | ICD-10-CM | POA: Diagnosis not present

## 2016-09-18 DIAGNOSIS — H04123 Dry eye syndrome of bilateral lacrimal glands: Secondary | ICD-10-CM | POA: Diagnosis not present

## 2017-01-09 DIAGNOSIS — J029 Acute pharyngitis, unspecified: Secondary | ICD-10-CM | POA: Diagnosis not present

## 2017-01-09 DIAGNOSIS — B084 Enteroviral vesicular stomatitis with exanthem: Secondary | ICD-10-CM | POA: Diagnosis not present

## 2017-01-09 DIAGNOSIS — J020 Streptococcal pharyngitis: Secondary | ICD-10-CM | POA: Diagnosis not present

## 2017-04-26 DIAGNOSIS — Z6821 Body mass index (BMI) 21.0-21.9, adult: Secondary | ICD-10-CM | POA: Diagnosis not present

## 2017-04-26 DIAGNOSIS — Z23 Encounter for immunization: Secondary | ICD-10-CM | POA: Diagnosis not present

## 2017-04-26 DIAGNOSIS — Z01419 Encounter for gynecological examination (general) (routine) without abnormal findings: Secondary | ICD-10-CM | POA: Diagnosis not present

## 2017-04-26 DIAGNOSIS — Z1231 Encounter for screening mammogram for malignant neoplasm of breast: Secondary | ICD-10-CM | POA: Diagnosis not present

## 2017-08-09 DIAGNOSIS — L810 Postinflammatory hyperpigmentation: Secondary | ICD-10-CM | POA: Diagnosis not present

## 2017-08-09 DIAGNOSIS — L700 Acne vulgaris: Secondary | ICD-10-CM | POA: Diagnosis not present

## 2017-10-14 DIAGNOSIS — H04123 Dry eye syndrome of bilateral lacrimal glands: Secondary | ICD-10-CM | POA: Diagnosis not present

## 2017-10-14 DIAGNOSIS — H524 Presbyopia: Secondary | ICD-10-CM | POA: Diagnosis not present

## 2017-10-14 DIAGNOSIS — H5213 Myopia, bilateral: Secondary | ICD-10-CM | POA: Diagnosis not present

## 2017-11-08 DIAGNOSIS — L700 Acne vulgaris: Secondary | ICD-10-CM | POA: Diagnosis not present

## 2018-02-17 DIAGNOSIS — N390 Urinary tract infection, site not specified: Secondary | ICD-10-CM | POA: Diagnosis not present

## 2018-02-17 DIAGNOSIS — N9489 Other specified conditions associated with female genital organs and menstrual cycle: Secondary | ICD-10-CM | POA: Diagnosis not present

## 2018-02-17 DIAGNOSIS — M797 Fibromyalgia: Secondary | ICD-10-CM | POA: Diagnosis not present

## 2018-02-17 DIAGNOSIS — N3010 Interstitial cystitis (chronic) without hematuria: Secondary | ICD-10-CM | POA: Diagnosis not present

## 2018-03-15 DIAGNOSIS — L700 Acne vulgaris: Secondary | ICD-10-CM | POA: Diagnosis not present

## 2018-09-29 DIAGNOSIS — Z13220 Encounter for screening for lipoid disorders: Secondary | ICD-10-CM | POA: Diagnosis not present

## 2018-09-29 DIAGNOSIS — Z130 Encounter for screening for diseases of the blood and blood-forming organs and certain disorders involving the immune mechanism: Secondary | ICD-10-CM | POA: Diagnosis not present

## 2018-09-29 DIAGNOSIS — Z131 Encounter for screening for diabetes mellitus: Secondary | ICD-10-CM | POA: Diagnosis not present

## 2018-09-29 DIAGNOSIS — Z1329 Encounter for screening for other suspected endocrine disorder: Secondary | ICD-10-CM | POA: Diagnosis not present

## 2018-09-29 DIAGNOSIS — Z0000 Encounter for general adult medical examination without abnormal findings: Secondary | ICD-10-CM | POA: Diagnosis not present

## 2018-09-29 DIAGNOSIS — N920 Excessive and frequent menstruation with regular cycle: Secondary | ICD-10-CM | POA: Diagnosis not present

## 2018-09-29 DIAGNOSIS — Z01419 Encounter for gynecological examination (general) (routine) without abnormal findings: Secondary | ICD-10-CM | POA: Diagnosis not present

## 2018-09-29 DIAGNOSIS — Z1231 Encounter for screening mammogram for malignant neoplasm of breast: Secondary | ICD-10-CM | POA: Diagnosis not present

## 2018-09-29 DIAGNOSIS — Z6820 Body mass index (BMI) 20.0-20.9, adult: Secondary | ICD-10-CM | POA: Diagnosis not present

## 2019-09-07 DIAGNOSIS — H5213 Myopia, bilateral: Secondary | ICD-10-CM | POA: Diagnosis not present

## 2019-09-07 DIAGNOSIS — H04123 Dry eye syndrome of bilateral lacrimal glands: Secondary | ICD-10-CM | POA: Diagnosis not present

## 2019-10-30 DIAGNOSIS — E7800 Pure hypercholesterolemia, unspecified: Secondary | ICD-10-CM | POA: Diagnosis not present

## 2019-10-30 DIAGNOSIS — N898 Other specified noninflammatory disorders of vagina: Secondary | ICD-10-CM | POA: Diagnosis not present

## 2019-10-30 DIAGNOSIS — E039 Hypothyroidism, unspecified: Secondary | ICD-10-CM | POA: Diagnosis not present

## 2019-10-30 DIAGNOSIS — Z8632 Personal history of gestational diabetes: Secondary | ICD-10-CM | POA: Diagnosis not present

## 2019-10-30 DIAGNOSIS — Z1231 Encounter for screening mammogram for malignant neoplasm of breast: Secondary | ICD-10-CM | POA: Diagnosis not present

## 2019-10-30 DIAGNOSIS — Z01419 Encounter for gynecological examination (general) (routine) without abnormal findings: Secondary | ICD-10-CM | POA: Diagnosis not present

## 2019-10-30 DIAGNOSIS — Z6821 Body mass index (BMI) 21.0-21.9, adult: Secondary | ICD-10-CM | POA: Diagnosis not present

## 2019-10-30 DIAGNOSIS — Z1151 Encounter for screening for human papillomavirus (HPV): Secondary | ICD-10-CM | POA: Diagnosis not present

## 2019-10-31 ENCOUNTER — Other Ambulatory Visit: Payer: Self-pay | Admitting: Obstetrics

## 2019-10-31 DIAGNOSIS — R921 Mammographic calcification found on diagnostic imaging of breast: Secondary | ICD-10-CM

## 2019-11-10 ENCOUNTER — Other Ambulatory Visit: Payer: Self-pay | Admitting: Obstetrics

## 2019-11-10 ENCOUNTER — Ambulatory Visit
Admission: RE | Admit: 2019-11-10 | Discharge: 2019-11-10 | Disposition: A | Discharge status: Home / Self Care | Payer: BC Managed Care – PPO | Source: Ambulatory Visit | Attending: Obstetrics | Admitting: Obstetrics

## 2019-11-10 ENCOUNTER — Other Ambulatory Visit: Payer: Self-pay

## 2019-11-10 DIAGNOSIS — R921 Mammographic calcification found on diagnostic imaging of breast: Secondary | ICD-10-CM

## 2019-11-10 DIAGNOSIS — R928 Other abnormal and inconclusive findings on diagnostic imaging of breast: Secondary | ICD-10-CM

## 2019-11-27 ENCOUNTER — Ambulatory Visit
Admission: RE | Admit: 2019-11-27 | Discharge: 2019-11-27 | Disposition: A | Discharge status: Home / Self Care | Payer: BC Managed Care – PPO | Source: Ambulatory Visit | Attending: Obstetrics | Admitting: Obstetrics

## 2019-11-27 ENCOUNTER — Other Ambulatory Visit: Payer: Self-pay

## 2019-11-27 DIAGNOSIS — R921 Mammographic calcification found on diagnostic imaging of breast: Secondary | ICD-10-CM

## 2019-11-27 DIAGNOSIS — N6012 Diffuse cystic mastopathy of left breast: Secondary | ICD-10-CM | POA: Diagnosis not present

## 2019-12-15 ENCOUNTER — Other Ambulatory Visit: Payer: Self-pay | Admitting: Surgery

## 2019-12-15 ENCOUNTER — Ambulatory Visit: Payer: Self-pay | Admitting: Surgery

## 2019-12-15 DIAGNOSIS — N6092 Unspecified benign mammary dysplasia of left breast: Secondary | ICD-10-CM

## 2019-12-15 DIAGNOSIS — N6452 Nipple discharge: Secondary | ICD-10-CM

## 2019-12-15 DIAGNOSIS — R922 Inconclusive mammogram: Secondary | ICD-10-CM | POA: Diagnosis not present

## 2019-12-30 DIAGNOSIS — R21 Rash and other nonspecific skin eruption: Secondary | ICD-10-CM | POA: Diagnosis not present

## 2020-01-25 ENCOUNTER — Ambulatory Visit
Admission: RE | Admit: 2020-01-25 | Discharge: 2020-01-25 | Disposition: A | Discharge status: Home / Self Care | Payer: BC Managed Care – PPO | Source: Ambulatory Visit | Attending: Surgery | Admitting: Surgery

## 2020-01-25 ENCOUNTER — Other Ambulatory Visit: Payer: Self-pay

## 2020-01-25 DIAGNOSIS — N6452 Nipple discharge: Secondary | ICD-10-CM

## 2020-01-25 DIAGNOSIS — N6002 Solitary cyst of left breast: Secondary | ICD-10-CM | POA: Diagnosis not present

## 2020-01-25 DIAGNOSIS — N6001 Solitary cyst of right breast: Secondary | ICD-10-CM | POA: Diagnosis not present

## 2020-01-25 MED ORDER — GADOBUTROL 1 MMOL/ML IV SOLN
5.0000 mL | Freq: Once | INTRAVENOUS | Status: AC | PRN
  Administered 2020-01-25: 5 mL via INTRAVENOUS

## 2020-01-26 ENCOUNTER — Ambulatory Visit: Payer: Self-pay | Admitting: Surgery

## 2020-01-26 NOTE — H&P (Signed)
Karen Pruitt Appointment: 12/15/2019 9:30 AM Location: [facility] Patient #: [id number] DOB: Apr 16, 1971 Married / Language: Karen Pruitt / Race: White Female   History of Present Illness Karen Pruitt. Karen Garraway Karen Pruitt; 12/15/2019 10:41 PM) The patient is a 49 year old female who presents with a breast mass. Referred by Dr. Aloha Pruitt for left breast atypical ductal hyperplasia and intermittent nipple discharge  This is a healthy 49 year old female who presents with intermittent dark-colored bilateral nipple discharge over the last few years. She had a recent mammogram that showed an area of suspicion in the left breast. Further evaluation showed a 1 cm area of microcalcifications in the lower inner quadrant. This area was biopsied and showed ADH. No further discharge noted. The patient has very dense breasts (D).   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 Saralyn Pruitt reviewed the case and concurs with the diagnosis. The [facility] was notified on 11/28/2019. Karen Folds Karen Pruitt Pathologist, Electronic Signature (Case signed 11/28/2019)   CLINICAL DATA: 49 year old patient recalled from recent screening mammogram for evaluation of left breast calcifications. In discussion with the patient today, she describes seeing a drop of brown/reddish fluid at the end of her left nipple recently.  EXAM: DIGITAL DIAGNOSTIC LEFT MAMMOGRAM WITH CAD AND TOMO  ULTRASOUND LEFT BREAST  COMPARISON: October 30, 2019  ACR Breast Density Category d: The breast tissue is extremely dense, which lowers the sensitivity of mammography.  FINDINGS: Magnification views of the left breast  confirm a 1.0 x 0.6 x 0.6 cm group of pleomorphic calcifications in the lower and slightly medial/central left breast. Calcifications are in the middle third of the breast parenchyma. There are a few singular scattered calcifications in the left breast, but no additional focal grouping of calcification is identified.  Mammographic images were processed with CAD.  On physical exam, I do not palpate a mass in the inferior left breast.  Targeted ultrasound is performed, showing extremely dense breast parenchyma in the inferior central/inferior medial left breast, with no sonographic correlate to the calcifications, and no mass identified.  Ultrasound of the left axilla is negative for lymphadenopathy.  IMPRESSION: Suspicious 1.0 cm group of calcifications in the lower inner quadrant of the left breast. Patient reports a single recent episode of brown to red fluid on the end of the left nipple.  Extremely dense breast parenchyma.  Negative for left axillary lymphadenopathy.  RECOMMENDATION: Stereotactic biopsy of left breast calcifications is recommended and is being scheduled for the patient. If the biopsy is positive for malignancy, breast MRI is suggested for evaluation of extent of disease.  I have discussed the findings and recommendations with the patient. If applicable, a reminder letter will be sent to the patient regarding the next appointment.  BI-RADS CATEGORY 4: Suspicious.   Electronically Signed By: Karen Pruitt M.D. On: 11/10/2019 10:38  CLINICAL DATA: Left breast calcifications.  EXAM: LEFT BREAST STEREOTACTIC CORE NEEDLE BIOPSY  COMPARISON: Previous exams.  FINDINGS: The patient and I discussed the procedure of stereotactic-guided biopsy including benefits and alternatives. We discussed the high likelihood of a successful procedure. We discussed the risks of the procedure including infection, bleeding, tissue injury, clip migration, and  inadequate sampling. Informed written consent was given.  The usual time out protocol was performed immediately prior to the procedure.  Using sterile technique and 1% lidocaine and 1% lidocaine with epinephrine as local anesthetic, under stereotactic guidance, a 9 gauge vacuum assisted device was used to perform core needle biopsy of calcifications in the lower inner quadrant of the left breast using a medial to lateral approach. Specimen radiograph was performed showing calcifications are present in the tissue samples. Specimens with calcifications are identified for pathology.  Lesion quadrant: Lower inner quadrant  At the conclusion of the procedure, coil shaped tissue marker clip was deployed into the biopsy cavity. Follow-up 2-view mammogram was performed and dictated separately.  IMPRESSION: Stereotactic-guided biopsy of the left breast. No apparent complications.  Electronically Signed: By: Karen Pruitt M.D. On: 11/27/2019 10:02  CLINICAL DATA: Left breast calcifications.  EXAM: 3D DIAGNOSTIC LEFT MAMMOGRAM POST STEREOTACTIC BIOPSY  COMPARISON: Previous exam(s).  FINDINGS: 3D Mammographic images were obtained following stereotactic guided biopsy of calcifications in the lower-inner quadrant of the left breast. The biopsy marking clip is in the lower-inner quadrant of the left breast just medial to the biopsied calcifications.  IMPRESSION: Appropriate positioning of the coil shaped shaped biopsy marking clip at the site of biopsy in the in the expected location in the lower-inner quadrant of the left breast medial and adjacent to the biopsied calcifications.  Final Assessment: Post Procedure Mammograms for Marker Placement   Electronically Signed By: Karen Pruitt M.D. On: 11/27/2019 10:18   Problem List/Past Medical Karen Key K. Rowen Hur, Karen Pruitt; 12/15/2019 10:41 PM) ATYPICAL DUCTAL HYPERPLASIA OF LEFT BREAST (N60.92)  BLOODY DISCHARGE FROM LEFT NIPPLE (N64.52)   BREAST DENSITY (R92.2)   Past Surgical History Karen Coke, Karen Pruitt; 12/15/2019 9:36 AM) Breast Biopsy  Left. Cesarean Section - Multiple  Foot Surgery  Right. Oral Surgery   Diagnostic Studies History Karen Coke, Karen Pruitt; 12/15/2019 9:36 AM) Colonoscopy  never Mammogram  within last year Pap Smear  1-5 years ago  Allergies Karen Coke, Karen Pruitt; 12/15/2019 9:36 AM) No Known Drug Allergies  [12/15/2019]: Allergies Reconciled   Medication History (Diane Herrin, Karen Pruitt; 12/15/2019 9:37 AM) ZyrTEC Allergy (10MG  Tablet, Oral) Active. Medications Reconciled  Social History Karen Coke, Karen Pruitt; 12/15/2019 9:36 AM) Alcohol use  Moderate alcohol use. Caffeine use  Coffee. No drug use  Tobacco use  Never smoker.  Family History Karen Coke, Karen Pruitt; 12/15/2019 9:36 AM) Alcohol Abuse  Father. Arthritis  Mother. Bleeding disorder  Mother. Cancer  Family Members In General. Cerebrovascular Accident  Father. Depression  Father, Mother. Heart Disease  Father. Hypertension  Mother. Kidney Disease  Family Members In General. Respiratory Condition  Mother.  Pregnancy / Birth History Karen Coke, Karen Pruitt; 12/15/2019 9:36 AM) Age at menarche  45 years. Contraceptive History  Oral contraceptives. Gravida  2 Irregular periods  Maternal age  54-40 Para  2  Other Problems Karen Pruitt. Bubba Vanbenschoten, Karen Pruitt; 12/15/2019 10:41 PM) Bladder Problems  Diabetes Mellitus  Heart murmur  Hypercholesterolemia     Review of Systems (Diane Herrin Karen Pruitt; 12/15/2019 9:36 AM) General Not Present- Appetite Loss, Chills, Fatigue, Fever, Night Sweats, Weight Gain and Weight Loss. Skin Not Present- Change in Wart/Mole, Dryness, Hives, Jaundice, New Lesions, Non-Healing Wounds, Rash and Ulcer. HEENT Present- Wears glasses/contact lenses. Not Present- Earache, Hearing Loss, Hoarseness, Nose Bleed, Oral Ulcers, Ringing in the Ears, Seasonal Allergies, Sinus Pain, Sore Throat, Visual Disturbances and Yellow  Eyes. Respiratory Not Present- Bloody sputum, Chronic Cough, Difficulty Breathing, Snoring and Wheezing. Breast Not Present- Breast Mass, Breast Pain, Nipple Discharge and Skin Changes. Cardiovascular  Not Present- Chest Pain, Difficulty Breathing Lying Down, Leg Cramps, Palpitations, Rapid Heart Rate, Shortness of Breath and Swelling of Extremities. Gastrointestinal Not Present- Abdominal Pain, Bloating, Bloody Stool, Change in Bowel Habits, Chronic diarrhea, Constipation, Difficulty Swallowing, Excessive gas, Gets full quickly at meals, Hemorrhoids, Indigestion, Nausea, Rectal Pain and Vomiting. Female Genitourinary Not Present- Frequency, Nocturia, Painful Urination, Pelvic Pain and Urgency. Musculoskeletal Not Present- Back Pain, Joint Pain, Joint Stiffness, Muscle Pain, Muscle Weakness and Swelling of Extremities. Neurological Not Present- Decreased Memory, Fainting, Headaches, Numbness, Seizures, Tingling, Tremor, Trouble walking and Weakness. Psychiatric Not Present- Anxiety, Bipolar, Change in Sleep Pattern, Depression, Fearful and Frequent crying. Endocrine Not Present- Cold Intolerance, Excessive Hunger, Hair Changes, Heat Intolerance, Hot flashes and New Diabetes.  Vitals (Diane Herrin Karen Pruitt; 12/15/2019 9:37 AM) 12/15/2019 9:37 AM Weight: 108.38 lb Height: 59in Body Surface Area: 1.42 m Body Mass Index: 21.89 kg/m  Temp.: 98.51F  Pulse: 70 (Regular)  P.OX: 99% (Room air) BP: 108/74(Sitting, Left Arm, Standard)       Physical Exam Karen Key K. Charlaine Utsey Karen Pruitt; 12/15/2019 10:42 PM) The physical exam findings are as follows: Note: Constitutional: WDWN in NAD, conversant, no obvious deformities; resting comfortably Eyes: Pupils equal, round; sclera anicteric; moist conjunctiva; no lid lag HENT: Oral mucosa moist; good dentition Neck: No masses palpated, trachea midline; no thyromegaly Breasts: symmetric; dense breast tissue; no dominant masses; no nipple retraction or discharge; no  axillary lymphadenopathy Lungs: CTA bilaterally; normal respiratory effort CV: Regular rate and rhythm; no murmurs; extremities well-perfused with no edema Abd: +bowel sounds, soft, non-tender, no palpable organomegaly; no palpable hernias Musc: Normal gait; no apparent clubbing or cyanosis in extremities Lymphatic: No palpable cervical or axillary lymphadenopathy Skin: Warm, dry; no sign of jaundice Psychiatric - alert and oriented x 4; calm mood and affect    Assessment & Plan Karen Key K. Yui Mulvaney Karen Pruitt; 12/15/2019 10:44 PM) ATYPICAL DUCTAL HYPERPLASIA OF LEFT BREAST (N60.92) BLOODY DISCHARGE FROM LEFT NIPPLE (N64.52) BREAST DENSITY (R92.2)   Schedule for Surgery - Left radioactive seed localized lumpectomy. The surgical procedure has been discussed with the patient. Potential risks, benefits, alternative treatments, and expected outcomes have been explained. All of the patient's questions at this time have been answered. The likelihood of reaching the patient's treatment goal is good. The patient understand the proposed surgical procedure and wishes to proceed.  Note:MRI is indicated because of the significant density of her breasts and the history of bilateral nipple discharge.  Karen Pruitt. Georgette Dover, Karen Pruitt, [facility]  General/ Trauma Surgery   01/26/2020 1:18 PM

## 2020-01-26 NOTE — H&P (View-Only) (Signed)
Karen Pruitt Appointment: 12/15/2019 9:30 AM Location: [facility] Patient #: [id number] DOB: 03-13-1971 Married / Language: [name] / Race: White Female   History of Present Illness Imogene Burn. Tyrece Vanterpool MD; 12/15/2019 10:41 PM) The patient is a 49 year old female who presents with a breast mass. Referred by Dr. Aloha Gell for left breast atypical ductal hyperplasia and intermittent nipple discharge  This is a healthy 49 year old female who presents with intermittent dark-colored bilateral nipple discharge over the last few years. She had a recent mammogram that showed an area of suspicion in the left breast. Further evaluation showed a 1 cm area of microcalcifications in the lower inner quadrant. This area was biopsied and showed ADH. No further discharge noted. The patient has very dense breasts (D).   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 Saralyn Pilar reviewed the case and concurs with the diagnosis. The [facility] was notified on 11/28/2019. [name] MD Pathologist, Electronic Signature (Case signed 11/28/2019)   CLINICAL DATA: 49 year old patient recalled from recent screening mammogram for evaluation of left breast calcifications. In discussion with the patient today, she describes seeing a drop of brown/reddish fluid at the end of her left nipple recently.  EXAM: DIGITAL DIAGNOSTIC LEFT MAMMOGRAM WITH CAD AND TOMO  ULTRASOUND LEFT BREAST  COMPARISON: October 30, 2019  ACR Breast Density Category d: The breast tissue is extremely dense, which lowers the sensitivity of mammography.  FINDINGS: Magnification views of the left breast  confirm a 1.0 x 0.6 x 0.6 cm group of pleomorphic calcifications in the lower and slightly medial/central left breast. Calcifications are in the middle third of the breast parenchyma. There are a few singular scattered calcifications in the left breast, but no additional focal grouping of calcification is identified.  Mammographic images were processed with CAD.  On physical exam, I do not palpate a mass in the inferior left breast.  Targeted ultrasound is performed, showing extremely dense breast parenchyma in the inferior central/inferior medial left breast, with no sonographic correlate to the calcifications, and no mass identified.  Ultrasound of the left axilla is negative for lymphadenopathy.  IMPRESSION: Suspicious 1.0 cm group of calcifications in the lower inner quadrant of the left breast. Patient reports a single recent episode of brown to red fluid on the end of the left nipple.  Extremely dense breast parenchyma.  Negative for left axillary lymphadenopathy.  RECOMMENDATION: Stereotactic biopsy of left breast calcifications is recommended and is being scheduled for the patient. If the biopsy is positive for malignancy, breast MRI is suggested for evaluation of extent of disease.  I have discussed the findings and recommendations with the patient. If applicable, a reminder letter will be sent to the patient regarding the next appointment.  BI-RADS CATEGORY 4: Suspicious.   Electronically Signed By: Curlene Dolphin M.D. On: 11/10/2019 10:38  CLINICAL DATA: Left breast calcifications.  EXAM: LEFT BREAST STEREOTACTIC CORE NEEDLE BIOPSY  COMPARISON: Previous exams.  FINDINGS: The patient and I discussed the procedure of stereotactic-guided biopsy including benefits and alternatives. We discussed the high likelihood of a successful procedure. We discussed the risks of the procedure including infection, bleeding, tissue injury, clip migration, and  inadequate sampling. Informed written consent was given.  The usual time out protocol was performed immediately prior to the procedure.  Using sterile technique and 1% lidocaine and 1% lidocaine with epinephrine as local anesthetic, under stereotactic guidance, a 9 gauge vacuum assisted device was used to perform core needle biopsy of calcifications in the lower inner quadrant of the left breast using a medial to lateral approach. Specimen radiograph was performed showing calcifications are present in the tissue samples. Specimens with calcifications are identified for pathology.  Lesion quadrant: Lower inner quadrant  At the conclusion of the procedure, coil shaped tissue marker clip was deployed into the biopsy cavity. Follow-up 2-view mammogram was performed and dictated separately.  IMPRESSION: Stereotactic-guided biopsy of the left breast. No apparent complications.  Electronically Signed: By: Lillia Mountain M.D. On: 11/27/2019 10:02  CLINICAL DATA: Left breast calcifications.  EXAM: 3D DIAGNOSTIC LEFT MAMMOGRAM POST STEREOTACTIC BIOPSY  COMPARISON: Previous exam(s).  FINDINGS: 3D Mammographic images were obtained following stereotactic guided biopsy of calcifications in the lower-inner quadrant of the left breast. The biopsy marking clip is in the lower-inner quadrant of the left breast just medial to the biopsied calcifications.  IMPRESSION: Appropriate positioning of the coil shaped shaped biopsy marking clip at the site of biopsy in the in the expected location in the lower-inner quadrant of the left breast medial and adjacent to the biopsied calcifications.  Final Assessment: Post Procedure Mammograms for Marker Placement   Electronically Signed By: Lillia Mountain M.D. On: 11/27/2019 10:18   Problem List/Past Medical Rodman Key K. Amond Speranza, MD; 12/15/2019 10:41 PM) ATYPICAL DUCTAL HYPERPLASIA OF LEFT BREAST (N60.92)  BLOODY DISCHARGE FROM LEFT NIPPLE (N64.52)   BREAST DENSITY (R92.2)   Past Surgical History Lindwood Coke, RN; 12/15/2019 9:36 AM) Breast Biopsy  Left. Cesarean Section - Multiple  Foot Surgery  Right. Oral Surgery   Diagnostic Studies History Lindwood Coke, RN; 12/15/2019 9:36 AM) Colonoscopy  never Mammogram  within last year Pap Smear  1-5 years ago  Allergies Lindwood Coke, RN; 12/15/2019 9:36 AM) No Known Drug Allergies  [12/15/2019]: Allergies Reconciled   Medication History (Diane Herrin, RN; 12/15/2019 9:37 AM) ZyrTEC Allergy (10MG  Tablet, Oral) Active. Medications Reconciled  Social History Lindwood Coke, RN; 12/15/2019 9:36 AM) Alcohol use  Moderate alcohol use. Caffeine use  Coffee. No drug use  Tobacco use  Never smoker.  Family History Lindwood Coke, RN; 12/15/2019 9:36 AM) Alcohol Abuse  Father. Arthritis  Mother. Bleeding disorder  Mother. Cancer  Family Members In General. Cerebrovascular Accident  Father. Depression  Father, Mother. Heart Disease  Father. Hypertension  Mother. Kidney Disease  Family Members In General. Respiratory Condition  Mother.  Pregnancy / Birth History Lindwood Coke, RN; 12/15/2019 9:36 AM) Age at menarche  17 years. Contraceptive History  Oral contraceptives. Gravida  2 Irregular periods  Maternal age  70-40 Para  2  Other Problems Imogene Burn. Tahjir Silveria, MD; 12/15/2019 10:41 PM) Bladder Problems  Diabetes Mellitus  Heart murmur  Hypercholesterolemia     Review of Systems (Diane Herrin RN; 12/15/2019 9:36 AM) General Not Present- Appetite Loss, Chills, Fatigue, Fever, Night Sweats, Weight Gain and Weight Loss. Skin Not Present- Change in Wart/Mole, Dryness, Hives, Jaundice, New Lesions, Non-Healing Wounds, Rash and Ulcer. HEENT Present- Wears glasses/contact lenses. Not Present- Earache, Hearing Loss, Hoarseness, Nose Bleed, Oral Ulcers, Ringing in the Ears, Seasonal Allergies, Sinus Pain, Sore Throat, Visual Disturbances and Yellow  Eyes. Respiratory Not Present- Bloody sputum, Chronic Cough, Difficulty Breathing, Snoring and Wheezing. Breast Not Present- Breast Mass, Breast Pain, Nipple Discharge and Skin Changes. Cardiovascular  Not Present- Chest Pain, Difficulty Breathing Lying Down, Leg Cramps, Palpitations, Rapid Heart Rate, Shortness of Breath and Swelling of Extremities. Gastrointestinal Not Present- Abdominal Pain, Bloating, Bloody Stool, Change in Bowel Habits, Chronic diarrhea, Constipation, Difficulty Swallowing, Excessive gas, Gets full quickly at meals, Hemorrhoids, Indigestion, Nausea, Rectal Pain and Vomiting. Female Genitourinary Not Present- Frequency, Nocturia, Painful Urination, Pelvic Pain and Urgency. Musculoskeletal Not Present- Back Pain, Joint Pain, Joint Stiffness, Muscle Pain, Muscle Weakness and Swelling of Extremities. Neurological Not Present- Decreased Memory, Fainting, Headaches, Numbness, Seizures, Tingling, Tremor, Trouble walking and Weakness. Psychiatric Not Present- Anxiety, Bipolar, Change in Sleep Pattern, Depression, Fearful and Frequent crying. Endocrine Not Present- Cold Intolerance, Excessive Hunger, Hair Changes, Heat Intolerance, Hot flashes and New Diabetes.  Vitals (Diane Herrin RN; 12/15/2019 9:37 AM) 12/15/2019 9:37 AM Weight: 108.38 lb Height: 59in Body Surface Area: 1.42 m Body Mass Index: 21.89 kg/m  Temp.: 98.27F  Pulse: 70 (Regular)  P.OX: 99% (Room air) BP: 108/74(Sitting, Left Arm, Standard)       Physical Exam Rodman Key K. Cristi Gwynn MD; 12/15/2019 10:42 PM) The physical exam findings are as follows: Note: Constitutional: WDWN in NAD, conversant, no obvious deformities; resting comfortably Eyes: Pupils equal, round; sclera anicteric; moist conjunctiva; no lid lag HENT: Oral mucosa moist; good dentition Neck: No masses palpated, trachea midline; no thyromegaly Breasts: symmetric; dense breast tissue; no dominant masses; no nipple retraction or discharge; no  axillary lymphadenopathy Lungs: CTA bilaterally; normal respiratory effort CV: Regular rate and rhythm; no murmurs; extremities well-perfused with no edema Abd: +bowel sounds, soft, non-tender, no palpable organomegaly; no palpable hernias Musc: Normal gait; no apparent clubbing or cyanosis in extremities Lymphatic: No palpable cervical or axillary lymphadenopathy Skin: Warm, dry; no sign of jaundice Psychiatric - alert and oriented x 4; calm mood and affect    Assessment & Plan Rodman Key K. Atul Delucia MD; 12/15/2019 10:44 PM) ATYPICAL DUCTAL HYPERPLASIA OF LEFT BREAST (N60.92) BLOODY DISCHARGE FROM LEFT NIPPLE (N64.52) BREAST DENSITY (R92.2)   Schedule for Surgery - Left radioactive seed localized lumpectomy. The surgical procedure has been discussed with the patient. Potential risks, benefits, alternative treatments, and expected outcomes have been explained. All of the patient's questions at this time have been answered. The likelihood of reaching the patient's treatment goal is good. The patient understand the proposed surgical procedure and wishes to proceed.  Note:MRI is indicated because of the significant density of her breasts and the history of bilateral nipple discharge.  Imogene Burn. Georgette Dover, MD, [facility]  General/ Trauma Surgery   01/26/2020 1:18 PM

## 2020-01-29 ENCOUNTER — Other Ambulatory Visit: Payer: Self-pay | Admitting: Surgery

## 2020-01-29 DIAGNOSIS — N6092 Unspecified benign mammary dysplasia of left breast: Secondary | ICD-10-CM

## 2020-01-30 ENCOUNTER — Encounter (HOSPITAL_BASED_OUTPATIENT_CLINIC_OR_DEPARTMENT_OTHER): Payer: Self-pay | Admitting: Surgery

## 2020-01-30 ENCOUNTER — Other Ambulatory Visit: Payer: Self-pay

## 2020-02-03 ENCOUNTER — Other Ambulatory Visit (HOSPITAL_COMMUNITY): Payer: BC Managed Care – PPO

## 2020-02-05 ENCOUNTER — Other Ambulatory Visit (HOSPITAL_COMMUNITY)
Admission: RE | Admit: 2020-02-05 | Discharge: 2020-02-05 | Disposition: A | Discharge status: Home / Self Care | Payer: BC Managed Care – PPO | Source: Ambulatory Visit | Attending: Surgery | Admitting: Surgery

## 2020-02-05 DIAGNOSIS — Z20822 Contact with and (suspected) exposure to covid-19: Secondary | ICD-10-CM | POA: Diagnosis not present

## 2020-02-05 DIAGNOSIS — Z01812 Encounter for preprocedural laboratory examination: Secondary | ICD-10-CM | POA: Diagnosis not present

## 2020-02-05 LAB — SARS CORONAVIRUS 2 (TAT 6-24 HRS): SARS Coronavirus 2: NEGATIVE

## 2020-02-05 MED ORDER — CHLORHEXIDINE GLUCONATE CLOTH 2 % EX PADS: 6.0000 | MEDICATED_PAD | Freq: Once | CUTANEOUS | Status: DC

## 2020-02-05 NOTE — Progress Notes (Signed)
      Enhanced Recovery after Surgery for Orthopedics Enhanced Recovery after Surgery is a protocol used to improve the stress on your body and your recovery after surgery.  Patient Instructions  . The night before surgery:  o No food after midnight. ONLY clear liquids after midnight  . The day of surgery (if you do NOT have diabetes):  o Drink ONE (1) Pre-Surgery Clear Ensure by 0730.  o This drink was given to you during your hospital  pre-op appointment visit. o The pre-op nurse will instruct you on the time to drink the  Pre-Surgery Ensure depending on your surgery time. o Finish the drink at the designated time by the pre-op nurse.  o Nothing else to drink after completing the  Pre-Surgery Clear Ensure.  . The day of surgery (if you have diabetes): o Drink ONE (1) Gatorade 2 (G2) as directed. o This drink was given to you during your hospital  pre-op appointment visit.  o The pre-op nurse will instruct you on the time to drink the   Gatorade 2 (G2) depending on your surgery time. o Color of the Gatorade may vary. Red is not allowed. o Nothing else to drink after completing the  Gatorade 2 (G2).         If you have questions, please contact your surgeon's office.  Hibiclens given to pt with instructions

## 2020-02-06 ENCOUNTER — Ambulatory Visit
Admission: RE | Admit: 2020-02-06 | Discharge: 2020-02-06 | Disposition: A | Discharge status: Home / Self Care | Payer: BC Managed Care – PPO | Source: Ambulatory Visit | Attending: Surgery | Admitting: Surgery

## 2020-02-06 ENCOUNTER — Other Ambulatory Visit: Payer: Self-pay

## 2020-02-06 DIAGNOSIS — R921 Mammographic calcification found on diagnostic imaging of breast: Secondary | ICD-10-CM | POA: Diagnosis not present

## 2020-02-06 DIAGNOSIS — N6092 Unspecified benign mammary dysplasia of left breast: Secondary | ICD-10-CM

## 2020-02-07 ENCOUNTER — Ambulatory Visit (HOSPITAL_BASED_OUTPATIENT_CLINIC_OR_DEPARTMENT_OTHER)
Admission: RE | Admit: 2020-02-07 | Discharge: 2020-02-07 | Disposition: A | Discharge status: Home / Self Care | Payer: BC Managed Care – PPO | Attending: Surgery | Admitting: Surgery

## 2020-02-07 ENCOUNTER — Ambulatory Visit
Admission: RE | Admit: 2020-02-07 | Discharge: 2020-02-07 | Disposition: A | Discharge status: Home / Self Care | Payer: BC Managed Care – PPO | Source: Ambulatory Visit | Attending: Surgery | Admitting: Surgery

## 2020-02-07 ENCOUNTER — Encounter (HOSPITAL_BASED_OUTPATIENT_CLINIC_OR_DEPARTMENT_OTHER): Payer: Self-pay | Admitting: Surgery

## 2020-02-07 ENCOUNTER — Ambulatory Visit (HOSPITAL_BASED_OUTPATIENT_CLINIC_OR_DEPARTMENT_OTHER): Payer: BC Managed Care – PPO | Admitting: Certified Registered Nurse Anesthetist

## 2020-02-07 ENCOUNTER — Other Ambulatory Visit: Payer: Self-pay

## 2020-02-07 ENCOUNTER — Encounter (HOSPITAL_BASED_OUTPATIENT_CLINIC_OR_DEPARTMENT_OTHER)
Admission: RE | Disposition: A | Discharge status: Home / Self Care | Payer: Self-pay | Source: Home / Self Care | Attending: Surgery

## 2020-02-07 DIAGNOSIS — C50919 Malignant neoplasm of unspecified site of unspecified female breast: Secondary | ICD-10-CM

## 2020-02-07 DIAGNOSIS — G709 Myoneural disorder, unspecified: Secondary | ICD-10-CM | POA: Insufficient documentation

## 2020-02-07 DIAGNOSIS — E7800 Pure hypercholesterolemia, unspecified: Secondary | ICD-10-CM | POA: Diagnosis not present

## 2020-02-07 DIAGNOSIS — R928 Other abnormal and inconclusive findings on diagnostic imaging of breast: Secondary | ICD-10-CM | POA: Diagnosis not present

## 2020-02-07 DIAGNOSIS — D0512 Intraductal carcinoma in situ of left breast: Secondary | ICD-10-CM | POA: Diagnosis not present

## 2020-02-07 DIAGNOSIS — D0592 Unspecified type of carcinoma in situ of left breast: Secondary | ICD-10-CM | POA: Insufficient documentation

## 2020-02-07 DIAGNOSIS — F418 Other specified anxiety disorders: Secondary | ICD-10-CM | POA: Diagnosis not present

## 2020-02-07 DIAGNOSIS — F329 Major depressive disorder, single episode, unspecified: Secondary | ICD-10-CM | POA: Diagnosis not present

## 2020-02-07 DIAGNOSIS — J45909 Unspecified asthma, uncomplicated: Secondary | ICD-10-CM | POA: Diagnosis not present

## 2020-02-07 DIAGNOSIS — E119 Type 2 diabetes mellitus without complications: Secondary | ICD-10-CM | POA: Diagnosis not present

## 2020-02-07 DIAGNOSIS — E785 Hyperlipidemia, unspecified: Secondary | ICD-10-CM | POA: Insufficient documentation

## 2020-02-07 DIAGNOSIS — Z8249 Family history of ischemic heart disease and other diseases of the circulatory system: Secondary | ICD-10-CM | POA: Insufficient documentation

## 2020-02-07 DIAGNOSIS — N6012 Diffuse cystic mastopathy of left breast: Secondary | ICD-10-CM | POA: Diagnosis not present

## 2020-02-07 DIAGNOSIS — N6092 Unspecified benign mammary dysplasia of left breast: Secondary | ICD-10-CM | POA: Diagnosis not present

## 2020-02-07 DIAGNOSIS — F419 Anxiety disorder, unspecified: Secondary | ICD-10-CM | POA: Insufficient documentation

## 2020-02-07 DIAGNOSIS — M797 Fibromyalgia: Secondary | ICD-10-CM | POA: Diagnosis not present

## 2020-02-07 DIAGNOSIS — N926 Irregular menstruation, unspecified: Secondary | ICD-10-CM | POA: Insufficient documentation

## 2020-02-07 DIAGNOSIS — N6022 Fibroadenosis of left breast: Secondary | ICD-10-CM | POA: Diagnosis not present

## 2020-02-07 HISTORY — DX: Malignant neoplasm of unspecified site of unspecified female breast: C50.919

## 2020-02-07 HISTORY — PX: BREAST LUMPECTOMY WITH RADIOACTIVE SEED LOCALIZATION: SHX6424

## 2020-02-07 HISTORY — PX: BREAST LUMPECTOMY: SHX2

## 2020-02-07 LAB — POCT PREGNANCY, URINE: Preg Test, Ur: NEGATIVE

## 2020-02-07 SURGERY — BREAST LUMPECTOMY WITH RADIOACTIVE SEED LOCALIZATION
Anesthesia: General | Site: Breast | Laterality: Left

## 2020-02-07 MED ORDER — FENTANYL CITRATE (PF) 100 MCG/2ML IJ SOLN
25.0000 ug | INTRAMUSCULAR | Status: DC | PRN
  Administered 2020-02-07 (×2): 25 ug via INTRAVENOUS

## 2020-02-07 MED ORDER — OXYCODONE HCL 5 MG PO TABS: 5.0000 mg | ORAL_TABLET | Freq: Once | ORAL | Status: DC | PRN

## 2020-02-07 MED ORDER — GABAPENTIN 300 MG PO CAPS
ORAL_CAPSULE | ORAL | Status: AC
  Filled 2020-02-07: qty 1

## 2020-02-07 MED ORDER — FENTANYL CITRATE (PF) 100 MCG/2ML IJ SOLN
INTRAMUSCULAR | Status: AC
  Filled 2020-02-07: qty 2

## 2020-02-07 MED ORDER — ONDANSETRON HCL 4 MG/2ML IJ SOLN
INTRAMUSCULAR | Status: DC | PRN
  Administered 2020-02-07: 4 mg via INTRAVENOUS

## 2020-02-07 MED ORDER — ONDANSETRON HCL 4 MG/2ML IJ SOLN: 4.0000 mg | Freq: Four times a day (QID) | INTRAMUSCULAR | Status: DC | PRN

## 2020-02-07 MED ORDER — CEFAZOLIN SODIUM-DEXTROSE 2-4 GM/100ML-% IV SOLN
2.0000 g | INTRAVENOUS | Status: AC
  Administered 2020-02-07: 2 g via INTRAVENOUS

## 2020-02-07 MED ORDER — LIDOCAINE 2% (20 MG/ML) 5 ML SYRINGE
INTRAMUSCULAR | Status: DC | PRN
  Administered 2020-02-07: 40 mg via INTRAVENOUS

## 2020-02-07 MED ORDER — PROPOFOL 500 MG/50ML IV EMUL
INTRAVENOUS | Status: DC | PRN
Start: 2020-02-07 — End: 2020-02-07
  Administered 2020-02-07: 140 ug/kg/min via INTRAVENOUS

## 2020-02-07 MED ORDER — BUPIVACAINE HCL (PF) 0.25 % IJ SOLN
INTRAMUSCULAR | Status: DC | PRN
  Administered 2020-02-07: 8 mL

## 2020-02-07 MED ORDER — PROPOFOL 10 MG/ML IV BOLUS
INTRAVENOUS | Status: DC | PRN
  Administered 2020-02-07: 110 mg via INTRAVENOUS

## 2020-02-07 MED ORDER — GABAPENTIN 300 MG PO CAPS
300.0000 mg | ORAL_CAPSULE | ORAL | Status: AC
  Administered 2020-02-07: 300 mg via ORAL

## 2020-02-07 MED ORDER — HYDROCODONE-ACETAMINOPHEN 5-325 MG PO TABS
1.0000 | ORAL_TABLET | Freq: Four times a day (QID) | ORAL | 0 refills | Status: DC | PRN
Start: 2020-02-07 — End: 2020-02-19

## 2020-02-07 MED ORDER — DEXAMETHASONE SODIUM PHOSPHATE 10 MG/ML IJ SOLN
INTRAMUSCULAR | Status: DC | PRN
  Administered 2020-02-07: 5 mg via INTRAVENOUS

## 2020-02-07 MED ORDER — MIDAZOLAM HCL 2 MG/2ML IJ SOLN
INTRAMUSCULAR | Status: DC | PRN
  Administered 2020-02-07: 2 mg via INTRAVENOUS

## 2020-02-07 MED ORDER — FENTANYL CITRATE (PF) 100 MCG/2ML IJ SOLN
INTRAMUSCULAR | Status: DC | PRN
  Administered 2020-02-07 (×3): 25 ug via INTRAVENOUS

## 2020-02-07 MED ORDER — MIDAZOLAM HCL 2 MG/2ML IJ SOLN
INTRAMUSCULAR | Status: AC
  Filled 2020-02-07: qty 2

## 2020-02-07 MED ORDER — CEFAZOLIN SODIUM-DEXTROSE 2-4 GM/100ML-% IV SOLN
INTRAVENOUS | Status: AC
  Filled 2020-02-07: qty 100

## 2020-02-07 MED ORDER — OXYCODONE HCL 5 MG/5ML PO SOLN: 5.0000 mg | Freq: Once | ORAL | Status: DC | PRN

## 2020-02-07 MED ORDER — KETOROLAC TROMETHAMINE 30 MG/ML IJ SOLN
INTRAMUSCULAR | Status: DC | PRN
  Administered 2020-02-07: 30 mg via INTRAVENOUS

## 2020-02-07 MED ORDER — ACETAMINOPHEN 500 MG PO TABS
ORAL_TABLET | ORAL | Status: AC
  Filled 2020-02-07: qty 2

## 2020-02-07 MED ORDER — LIDOCAINE 2% (20 MG/ML) 5 ML SYRINGE
INTRAMUSCULAR | Status: AC
  Filled 2020-02-07: qty 5

## 2020-02-07 MED ORDER — ACETAMINOPHEN 500 MG PO TABS
1000.0000 mg | ORAL_TABLET | ORAL | Status: AC
  Administered 2020-02-07: 1000 mg via ORAL

## 2020-02-07 MED ORDER — LACTATED RINGERS IV SOLN: INTRAVENOUS | Status: DC

## 2020-02-07 SURGICAL SUPPLY — 54 items
APL PRP STRL LF DISP 70% ISPRP (MISCELLANEOUS) ×1
APL SKNCLS STERI-STRIP NONHPOA (GAUZE/BANDAGES/DRESSINGS) ×1
APPLIER CLIP 9.375 MED OPEN (MISCELLANEOUS)
APR CLP MED 9.3 20 MLT OPN (MISCELLANEOUS)
BENZOIN TINCTURE PRP APPL 2/3 (GAUZE/BANDAGES/DRESSINGS) ×2 IMPLANT
BLADE HEX COATED 2.75 (ELECTRODE) ×1 IMPLANT
BLADE SURG 15 STRL LF DISP TIS (BLADE) ×1 IMPLANT
BLADE SURG 15 STRL SS (BLADE) ×2
CANISTER SUC SOCK COL 7IN (MISCELLANEOUS) IMPLANT
CANISTER SUCT 1200ML W/VALVE (MISCELLANEOUS) IMPLANT
CHLORAPREP W/TINT 26 (MISCELLANEOUS) ×2 IMPLANT
CLIP APPLIE 9.375 MED OPEN (MISCELLANEOUS) ×1 IMPLANT
COVER BACK TABLE 60X90IN (DRAPES) ×2 IMPLANT
COVER MAYO STAND STRL (DRAPES) ×2 IMPLANT
COVER PROBE W GEL 5X96 (DRAPES) ×2 IMPLANT
COVER WAND RF STERILE (DRAPES) IMPLANT
DECANTER SPIKE VIAL GLASS SM (MISCELLANEOUS) IMPLANT
DRAPE LAPAROTOMY 100X72 PEDS (DRAPES) ×2 IMPLANT
DRAPE UTILITY XL STRL (DRAPES) ×2 IMPLANT
DRSG TEGADERM 4X4.75 (GAUZE/BANDAGES/DRESSINGS) ×3 IMPLANT
ELECT REM PT RETURN 9FT ADLT (ELECTROSURGICAL) ×2
ELECTRODE REM PT RTRN 9FT ADLT (ELECTROSURGICAL) ×1 IMPLANT
GAUZE SPONGE 4X4 12PLY STRL LF (GAUZE/BANDAGES/DRESSINGS) IMPLANT
GLOVE BIO SURGEON STRL SZ7 (GLOVE) ×2 IMPLANT
GLOVE BIOGEL PI IND STRL 7.0 (GLOVE) IMPLANT
GLOVE BIOGEL PI IND STRL 7.5 (GLOVE) ×1 IMPLANT
GLOVE BIOGEL PI INDICATOR 7.0 (GLOVE) ×2
GLOVE BIOGEL PI INDICATOR 7.5 (GLOVE) ×1
GLOVE ECLIPSE 6.5 STRL STRAW (GLOVE) ×1 IMPLANT
GOWN STRL REUS W/ TWL LRG LVL3 (GOWN DISPOSABLE) ×2 IMPLANT
GOWN STRL REUS W/TWL LRG LVL3 (GOWN DISPOSABLE) ×4
ILLUMINATOR WAVEGUIDE N/F (MISCELLANEOUS) IMPLANT
KIT MARKER MARGIN INK (KITS) ×2 IMPLANT
LIGHT WAVEGUIDE WIDE FLAT (MISCELLANEOUS) IMPLANT
NDL HYPO 25X1 1.5 SAFETY (NEEDLE) ×1 IMPLANT
NEEDLE HYPO 25X1 1.5 SAFETY (NEEDLE) ×2 IMPLANT
NS IRRIG 1000ML POUR BTL (IV SOLUTION) ×1 IMPLANT
PACK BASIN DAY SURGERY FS (CUSTOM PROCEDURE TRAY) ×2 IMPLANT
PENCIL SMOKE EVACUATOR (MISCELLANEOUS) ×2 IMPLANT
SLEEVE SCD COMPRESS KNEE MED (MISCELLANEOUS) ×2 IMPLANT
SPONGE GAUZE 2X2 8PLY STRL LF (GAUZE/BANDAGES/DRESSINGS) ×1 IMPLANT
SPONGE LAP 18X18 RF (DISPOSABLE) IMPLANT
SPONGE LAP 4X18 RFD (DISPOSABLE) ×2 IMPLANT
STRIP CLOSURE SKIN 1/2X4 (GAUZE/BANDAGES/DRESSINGS) ×2 IMPLANT
SUT MON AB 4-0 PC3 18 (SUTURE) ×2 IMPLANT
SUT SILK 2 0 SH (SUTURE) IMPLANT
SUT VIC AB 3-0 SH 27 (SUTURE) ×2
SUT VIC AB 3-0 SH 27X BRD (SUTURE) ×1 IMPLANT
SYR BULB EAR ULCER 3OZ GRN STR (SYRINGE) IMPLANT
SYR CONTROL 10ML LL (SYRINGE) ×2 IMPLANT
TOWEL GREEN STERILE FF (TOWEL DISPOSABLE) ×2 IMPLANT
TRAY FAXITRON CT DISP (TRAY / TRAY PROCEDURE) ×2 IMPLANT
TUBE CONNECTING 20X1/4 (TUBING) IMPLANT
YANKAUER SUCT BULB TIP NO VENT (SUCTIONS) IMPLANT

## 2020-02-07 NOTE — Interval H&P Note (Signed)
History and Physical Interval Note:  02/07/2020 9:15 AM  Karen Pruitt  has presented today for surgery, with the diagnosis of LEFT BREAST ATYPICAL DUCTAL HYERPLASIA.  The various methods of treatment have been discussed with the patient and family. After consideration of risks, benefits and other options for treatment, the patient has consented to  Procedure(s): LEFT BREAST LUMPECTOMY WITH RADIOACTIVE SEED LOCALIZATION (Left) as a surgical intervention.  The patient's history has been reviewed, patient examined, no change in status, stable for surgery.  I have reviewed the patient's chart and labs.  Questions were answered to the patient's satisfaction.     Maia Petties

## 2020-02-07 NOTE — Op Note (Signed)
Pre-op Diagnosis:  Left breast Atypical ductal hyperplasia Post-op Diagnosis: same Procedure:  Left radioactive seed localized lumpectomy Surgeon:  Falesha Schommer K. Anesthesia:  GEN - LMA Indications:  This is a healthy 49 year old female who presents with intermittent dark-colored bilateral nipple discharge over the last few years. She had a recent mammogram that showed an area of suspicion in the left breast. Further evaluation showed a 1 cm area of microcalcifications in the lower inner quadrant. This area was biopsied and showed ADH. No further discharge noted. The patient has very dense breasts (D).  Description of procedure: The patient is brought to the operating room placed in supine position on the operating room table. After an adequate level of general anesthesia was obtained, her left breast was prepped with ChloraPrep and draped in sterile fashion. A timeout was taken to ensure the proper patient and proper procedure. We interrogated the breast with the neoprobe. We made a circumareolar incision around the lower side of the nipple after infiltrating with 0.25% Marcaine. Dissection was carried down in the breast tissue with cautery. We used the neoprobe to guide [place] towards the radioactive seed. We excised an area of tissue around the radioactive seed 2 cm in diameter. The specimen was removed and was oriented with a paint kit. Specimen mammogram showed the radioactive seed as well as the biopsy clip within the specimen. This was sent for pathologic examination. There is no residual radioactivity within the biopsy cavity. We inspected carefully for hemostasis. The wound was thoroughly irrigated. The wound was closed with a deep layer of 3-0 Vicryl and a subcuticular layer of 4-0 Monocryl. Benzoin Steri-Strips were applied. The patient was then extubated and brought to the recovery room in stable condition. All sponge, instrument, and needle counts are correct.  Imogene Burn. Georgette Dover, MD,  [facility]  General/ Trauma Surgery  02/07/2020 11:26 AM

## 2020-02-07 NOTE — Discharge Instructions (Signed)
[place] Office Phone Number [phone]  BREAST BIOPSY/ PARTIAL MASTECTOMY: POST OP INSTRUCTIONS  Always review your discharge instruction sheet given to you by the facility where your surgery was performed.  IF YOU HAVE DISABILITY OR FAMILY LEAVE FORMS, YOU MUST BRING THEM TO THE OFFICE FOR PROCESSING.  DO NOT GIVE THEM TO YOUR DOCTOR.  1. A prescription for pain medication may be given to you upon discharge.  Take your pain medication as prescribed, if needed.  If narcotic pain medicine is not needed, then you may take acetaminophen (Tylenol) or ibuprofen (Advil) as needed. 2. Take your usually prescribed medications unless otherwise directed 3. If you need a refill on your pain medication, please contact your pharmacy.  They will contact our office to request authorization.  Prescriptions will not be filled after 5pm or on week-ends. 4. You should eat very light the first 24 hours after surgery, such as soup, crackers, pudding, etc.  Resume your normal diet the day after surgery. 5. Most patients will experience some swelling and bruising in the breast.  Ice packs and a good support bra will help.  Swelling and bruising can take several days to resolve.  6. It is common to experience some constipation if taking pain medication after surgery.  Increasing fluid intake and taking a stool softener will usually help or prevent this problem from occurring.  A mild laxative (Milk of Magnesia or Miralax) should be taken according to package directions if there are no bowel movements after 48 hours. 7. Unless discharge instructions indicate otherwise, you may remove your bandages 24-48 hours after surgery, and you may shower at that time.  You may have steri-strips (small skin tapes) in place directly over the incision.  These strips should be left on the skin for 7-10 days.  If your surgeon used skin glue on the incision, you may shower in 24 hours.  The glue will flake off over the  next 2-3 weeks.  Any sutures or staples will be removed at the office during your follow-up visit. 8. ACTIVITIES:  You may resume regular daily activities (gradually increasing) beginning the next day.  Wearing a good support bra or sports bra minimizes pain and swelling.  You may have sexual intercourse when it is comfortable. a. You may drive when you no longer are taking prescription pain medication, you can comfortably wear a seatbelt, and you can safely maneuver your car and apply brakes. b. RETURN TO WORK:  ______________________________________________________________________________________ 9. You should see your doctor in the office for a follow-up appointment approximately two weeks after your surgery.  Your doctor's nurse will typically make your follow-up appointment when she calls you with your pathology report.  Expect your pathology report 2-3 business days after your surgery.  You may call to check if you do not hear from [place] after three days. 10. OTHER INSTRUCTIONS: _______________________________________________________________________________________________ _____________________________________________________________________________________________________________________________________ _____________________________________________________________________________________________________________________________________ _____________________________________________________________________________________________________________________________________  WHEN TO CALL YOUR DOCTOR: 1. Fever over 101.0 2. Nausea and/or vomiting. 3. Extreme swelling or bruising. 4. Continued bleeding from incision. 5. Increased pain, redness, or drainage from the incision.  The clinic staff is available to answer your questions during regular business hours.  Please don't hesitate to call and ask to speak to one of the nurses for clinical concerns.  If you have a medical emergency, go to the nearest  emergency room or call 911.  A surgeon from [facility] is always on call at the hospital.  For further questions, please visit [email or web address]  Post Anesthesia Home Care Instructions  Activity: Get plenty of rest for the remainder of the day. A responsible individual must stay with you for 24 hours following the procedure.  For the next 24 hours, DO NOT: -Drive a car -Paediatric nurse -Drink alcoholic beverages -Take any medication unless instructed by your physician -Make any legal decisions or sign important papers.  Meals: Start with liquid foods such as gelatin or soup. Progress to regular foods as tolerated. Avoid greasy, spicy, heavy foods. If nausea and/or vomiting occur, drink only clear liquids until the nausea and/or vomiting subsides. Call your physician if vomiting continues.  Special Instructions/Symptoms: Your throat may feel dry or sore from the anesthesia or the breathing tube placed in your throat during surgery. If this causes discomfort, gargle with warm salt water. The discomfort should disappear within 24 hours.   *May have Tylenol at 3:30pm today 02/07/2020 *May have Ibuprofen at 7:30pm today 02/07/2020

## 2020-02-07 NOTE — Anesthesia Procedure Notes (Signed)
Procedure Name: LMA Insertion Date/Time: 02/07/2020 10:42 AM Performed by: [place] Dacota Devall C, CRNA Pre-anesthesia Checklist: Patient identified, Emergency Drugs available, Suction available and Patient being monitored Patient Re-evaluated:Patient Re-evaluated prior to induction Oxygen Delivery Method: Circle system utilized Preoxygenation: Pre-oxygenation with 100% oxygen Induction Type: IV induction Ventilation: Mask ventilation without difficulty LMA: LMA inserted LMA Size: 4.0 Number of attempts: 1 Airway Equipment and Method: Bite block Placement Confirmation: positive ETCO2 Tube secured with: Tape Dental Injury: Teeth and Oropharynx as per pre-operative assessment

## 2020-02-07 NOTE — Transfer of Care (Signed)
Immediate Anesthesia Transfer of Care Note  Patient: Karen Pruitt  Procedure(s) Performed: LEFT BREAST LUMPECTOMY WITH RADIOACTIVE SEED LOCALIZATION (Left Breast)  Patient Location: PACU  Anesthesia Type:General  Level of Consciousness: awake, alert  and oriented  Airway & Oxygen Therapy: Patient Spontanous Breathing and Patient connected to face mask oxygen  Post-op Assessment: Report given to RN and Post -op Vital signs reviewed and stable  Post vital signs: Reviewed and stable  Last Vitals:  Vitals Value Taken Time  BP 113/62 02/07/20 1133  Temp 36.4 C 02/07/20 1133  Pulse 91 02/07/20 1136  Resp 15 02/07/20 1136  SpO2 100 % 02/07/20 1136  Vitals shown include unvalidated device data.  Last Pain:  Vitals:   02/07/20 0922  TempSrc: Oral  PainSc: 0-No pain      Patients Stated Pain Goal: 1 (57/32/20 2542)  Complications: No complications documented.

## 2020-02-07 NOTE — Anesthesia Preprocedure Evaluation (Signed)
Anesthesia Evaluation  Patient identified by MRN, date of birth, ID band Patient awake    Reviewed: Allergy & Precautions, H&P , NPO status , Patient's Chart, lab work & pertinent test results  Airway Mallampati: II   Neck ROM: full    Dental   Pulmonary shortness of breath,    breath sounds clear to auscultation       Cardiovascular negative cardio ROS   Rhythm:regular Rate:Normal     Neuro/Psych PSYCHIATRIC DISORDERS Anxiety Depression  Neuromuscular disease    GI/Hepatic   Endo/Other    Renal/GU      Musculoskeletal   Abdominal   Peds  Hematology   Anesthesia Other Findings   Reproductive/Obstetrics                             Anesthesia Physical Anesthesia Plan  ASA: II  Anesthesia Plan: General   Post-op Pain Management:    Induction: Intravenous  PONV Risk Score and Plan: 3 and Ondansetron, Dexamethasone, Propofol infusion, Midazolam, Treatment may vary due to age or medical condition and TIVA  Airway Management Planned: LMA  Additional Equipment:   Intra-op Plan:   Post-operative Plan: Extubation in OR  Informed Consent: I have reviewed the patients History and Physical, chart, labs and discussed the procedure including the risks, benefits and alternatives for the proposed anesthesia with the patient or authorized representative who has indicated his/her understanding and acceptance.       Plan Discussed with: CRNA, Anesthesiologist and Surgeon  Anesthesia Plan Comments:         Anesthesia Quick Evaluation

## 2020-02-07 NOTE — Anesthesia Postprocedure Evaluation (Signed)
Anesthesia Post Note  Patient: Karen Pruitt  Procedure(s) Performed: LEFT BREAST LUMPECTOMY WITH RADIOACTIVE SEED LOCALIZATION (Left Breast)     Patient location during evaluation: PACU Anesthesia Type: General Level of consciousness: awake and alert Pain management: pain level controlled Vital Signs Assessment: post-procedure vital signs reviewed and stable Respiratory status: spontaneous breathing, nonlabored ventilation, respiratory function stable and patient connected to nasal cannula oxygen Cardiovascular status: blood pressure returned to baseline and stable Postop Assessment: no apparent nausea or vomiting Anesthetic complications: no   No complications documented.  Last Vitals:  Vitals:   02/07/20 1215 02/07/20 1243  BP: (!) 133/62 (!) 132/68  Pulse: 52 98  Resp: 13 16  Temp:  36.5 C  SpO2: 100% 100%    Last Pain:  Vitals:   02/07/20 1243  TempSrc: Oral  PainSc: [place]

## 2020-02-08 ENCOUNTER — Encounter (HOSPITAL_BASED_OUTPATIENT_CLINIC_OR_DEPARTMENT_OTHER): Payer: Self-pay | Admitting: Surgery

## 2020-02-09 LAB — SURGICAL PATHOLOGY

## 2020-02-12 ENCOUNTER — Telehealth: Payer: Self-pay | Admitting: Oncology

## 2020-02-12 NOTE — Telephone Encounter (Signed)
Received a new pt referral from Dr. Georgette Dover at [facility] for new dx of breast cancer. Karen Pruitt has been cld and scheduled to see Dr. Jana Hakim on 8/9 at 430pm w/labs at 4pm. Pt aware to arrive 15 minutes early.

## 2020-02-16 ENCOUNTER — Other Ambulatory Visit: Payer: Self-pay | Admitting: *Deleted

## 2020-02-16 DIAGNOSIS — C50919 Malignant neoplasm of unspecified site of unspecified female breast: Secondary | ICD-10-CM

## 2020-02-18 ENCOUNTER — Encounter: Payer: Self-pay | Admitting: Oncology

## 2020-02-18 NOTE — Progress Notes (Signed)
[facility]  [phone] [phone]     ID: Karen Pruitt DOB: [birth year]  MR#: [id number]  GYF#:[id number]  Patient Care Team: Aloha Gell, MD as PCP - General (Obstetrics and Gynecology) Donnie Mesa, MD as Consulting Physician (General Surgery) Nashaly Dorantes, Virgie Dad, MD as Consulting Physician (Oncology) Gery Pray, MD as Consulting Physician (Radiation Oncology) Martinique, Amy, MD as Consulting Physician (Dermatology) Bo Merino, MD as Consulting Physician (Rheumatology) Chauncey Cruel, MD OTHER MD:  CHIEF COMPLAINT: Noninvasive estrogen receptor positive breast cancer; 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 She underwent left diagnostic mammography with tomography and left breast ultrasonography at [facility] on 11/10/2019 showing: breast density category D; 1.0 cm group of calcifications in lower-inner left breast; negative for left axillary lymphadenopathy. At that time, she also noted a recent episode of dark colored nipple discharge.  Accordingly on 11/27/2019 she proceeded to biopsy of the left breast area in question. The pathology from this procedure ([id number]) showed: atypical ductal hyperplasia and flat epithelial atypia with calcifications; background breast parenchyma with fibrocystic change and pseudoangiomatous stromal hyperplasia.   She underwent breast MRI on 01/25/2020 showing: breast composition D; no evidence of breast malignancy; small bilateral breast cysts.  She proceeded to left lumpectomy on 02/07/2020 under Dr. Georgette Dover. 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 Prognostic indicators significant for: estrogen receptor, 95% positive and progesterone receptor, 10% positive, both with moderate staining intensity.   The patient's subsequent history is as detailed below.   INTERVAL HISTORY: [name] was evaluated in the breast cancer clinic on 02/19/2020.  Her husband [name] participated by speaker phone. Her case will be presented at the multidisciplinary breast cancer conference 02/21/2020  [name] did well with the surgery although she did develop a seroma requiring some drainage and "regluing".  REVIEW OF SYSTEMS: There were no specific symptoms leading to the original mammogram, which was routinely scheduled. The patient denies unusual headaches, visual changes, nausea, vomiting, stiff neck, dizziness, or gait imbalance. There has been no cough, phlegm production, or pleurisy, no chest pain or pressure, and no change in bowel or bladder habits. The patient denies fever, rash, bleeding, unexplained fatigue or unexplained weight loss. A detailed review of systems was otherwise entirely negative.   PAST MEDICAL HISTORY: Past Medical History:  Diagnosis Date   Anxiety    Anxiety and depression 06/20/2012   Depression    Interstitial cystitis    Maternal anemia complicating pregnancy, childbirth, or the puerperium 06/20/2012   Mild IDA w/ superimposed ABL anemia   Pelvic floor dysfunction    Postpartum care following cesarean delivery 06/20/2012   S/P cesarean section - elective primary 12/8 06/20/2012    PAST SURGICAL HISTORY: Past Surgical History:  Procedure Laterality Date   BREAST LUMPECTOMY WITH RADIOACTIVE SEED LOCALIZATION Left 02/07/2020   Procedure: LEFT BREAST LUMPECTOMY WITH RADIOACTIVE SEED LOCALIZATION;  Surgeon: Donnie Mesa, MD;  Location: [facility];  Service: General;  Laterality: Left;   CESAREAN SECTION  06/19/2012   Procedure: CESAREAN SECTION;  Surgeon: Floyce Stakes. Pamala Hurry, MD;  Location: [facility];   Service: Obstetrics;  Laterality: N/A;  Primary Cesarean Section    CESAREAN SECTION WITH BILATERAL TUBAL LIGATION N/A 09/05/2013   Procedure: CESAREAN SECTION  WITH BILATERAL TUBAL LIGATION;  Surgeon: Lenoard Aden, MD;  Location: [facility];  Service: Obstetrics;  Laterality: N/A;   CYSTOSCOPY WITH HYDRODISTENSION AND BIOPSY     TOE SURGERY      FAMILY HISTORY: Family History  Problem Relation Age of Onset   Arthritis Mother    Bleeding Disorder Mother    Depression Mother    Hypertension Mother    Alcohol abuse Father    Cerebrovascular Accident Father    Depression Father    Heart disease Father   The patient's parents are both 18 years old as of August 2021.  There is a paternal aunt with cancer, possibly pancreatic.  There is a maternal uncle with melanoma.  The patient has 1 sister, no brothers   GYNECOLOGIC HISTORY:  No LMP recorded. Menarche: 49 years old Age at first live birth: 49 years old GX P 2 LMP irregular Contraceptive: used for approximately 12 years, s/p tubal ligation HRT n/a  Hysterectomy? no BSO? 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  Her husband Jake Shark is Interior and spatial designer of IT at [employer].  Their daughters are 67 and 79 years old as of August 2021.  The patient attends [employer].    ADVANCED DIRECTIVES: In the absence of any documentation to the contrary, the patient's spouse is their HCPOA.    HEALTH MAINTENANCE: Social History   Tobacco Use   Smoking status: Never Smoker   Smokeless tobacco: Never Used  Vaping Use   Vaping Use: Never used  Substance Use Topics   Alcohol use: Yes    Comment: occas   Drug use: No     Colonoscopy: n/a (age)  PAP: April 2021  Bone density: n/a (age)   No Known Allergies  Current Outpatient Medications  Medication Sig Dispense Refill   tranexamic acid (LYSTEDA) 650 MG TABS tablet Take 2 tablets (1,300 mg total)  by mouth 3 (three) times daily. 30 tablet    No current facility-administered medications for this visit.    OBJECTIVE: White woman who appears younger than stated age  Vitals:   02/19/20 1615  BP: 123/78  Pulse: 72  Resp: 20  Temp: 98.3 F (36.8 C)  SpO2: 100%     Body mass index is 21.81 kg/m.   Wt Readings from Last 3 Encounters:  02/19/20 108 lb (49 kg)  02/07/20 106 lb 0.7 oz (48.1 kg)  09/05/13 129 lb (58.5 kg)      ECOG FS:1 - Symptomatic but completely ambulatory  Ocular: Sclerae unicteric, pupils round and equal Ear-nose-throat: Wearing a mask Lymphatic: No cervical or supraclavicular adenopathy Lungs no rales or rhonchi Heart regular rate and rhythm Abd soft, nontender, positive bowel sounds MSK no focal spinal tenderness, no joint edema Neuro: non-focal, well-oriented, appropriate affect Breasts: The right breast is unremarkable.  The left breast is status post recent lumpectomy.  The cosmetic result is excellent.  Both axillae are benign.   LAB RESULTS:  CMP     Component Value Date/Time   NA 141 02/19/2020 1607   K 3.8 02/19/2020 1607   CL 105 02/19/2020 1607   CO2 28 02/19/2020 1607   GLUCOSE 96 02/19/2020 1607   BUN 8 02/19/2020 1607   CREATININE 0.85 02/19/2020 1607   CALCIUM 10.2 02/19/2020 1607   PROT 6.9 02/19/2020 1607   ALBUMIN 4.1 02/19/2020 1607   AST 19 02/19/2020 1607   ALT 18 02/19/2020  1607   ALKPHOS 54 02/19/2020 1607   BILITOT 0.9 02/19/2020 1607   GFRNONAA >60 02/19/2020 1607   GFRAA >60 02/19/2020 1607    No results found for: TOTALPROTELP, ALBUMINELP, A1GS, A2GS, BETS, BETA2SER, GAMS, MSPIKE, SPEI  Lab Results  Component Value Date   WBC 8.1 02/19/2020   NEUTROABS 4.1 02/19/2020   HGB 12.1 02/19/2020   HCT 36.2 02/19/2020   MCV 88.9 02/19/2020   PLT 236 02/19/2020    No results found for: LABCA2  No components found for: PMXMDS692  No results for input(s): INR in the last 168 hours.  No results found for:  LABCA2  No results found for: GJL691  No results found for: FML008  No results found for: MEG024  No results found for: CA2729  No components found for: HGQUANT  No results found for: CEA1 / No results found for: CEA1   No results found for: AFPTUMOR  No results found for: CHROMOGRNA  No results found for: KPAFRELGTCHN, LAMBDASER, KAPLAMBRATIO (kappa/lambda light chains)  No results found for: HGBA, HGBA2QUANT, HGBFQUANT, HGBSQUAN (Hemoglobinopathy evaluation)   No results found for: LDH  No results found for: IRON, TIBC, IRONPCTSAT (Iron and TIBC)  No results found for: FERRITIN  Urinalysis No results found for: COLORURINE, APPEARANCEUR, LABSPEC, PHURINE, GLUCOSEU, HGBUR, BILIRUBINUR, KETONESUR, PROTEINUR, UROBILINOGEN, NITRITE, LEUKOCYTESUR   STUDIES: MR BREAST BILATERAL W WO CONTRAST INC CAD  Result Date: 01/25/2020 CLINICAL DATA:  Patient underwent stereotactic biopsy of left breast calcifications on 11/27/2019 with pathology revealing atypical ductal hyperplasia and flat epithelial atypia in the setting of fibrocystic change and Pash. At the time of diagnostic imaging, 11/10/2019, she also experienced a small amount of left nipple discharge. LABS:  No labs drawn at time of imaging. EXAM: BILATERAL BREAST MRI WITH AND WITHOUT CONTRAST TECHNIQUE: Multiplanar, multisequence MR images of both breasts were obtained prior to and following the intravenous administration of 5 ml of Gadavist Three-dimensional MR images were rendered by post-processing of the original MR data on an independent workstation. The three-dimensional MR images were interpreted, and findings are reported in the following complete MRI report for this study. Three dimensional images were evaluated at the independent DynaCad workstation COMPARISON:  Previous exam(s). FINDINGS: Breast composition: d. Extreme fibroglandular tissue. Background parenchymal enhancement: Mild. Right breast: No mass or abnormal  enhancement. Multiple small scattered cysts. Left breast: No mass or abnormal enhancement. Susceptibility artifact from the biopsy marker clip within the inferior aspect of the breast. Several small scattered cysts. Lymph nodes: No abnormal appearing lymph nodes. Ancillary findings:  None. IMPRESSION: 1. No evidence of breast malignancy. 2. Small bilateral breast cysts. RECOMMENDATION: 1. Patient was referred to surgery for the diagnosis of left breast atypical ductal hyperplasia. Surgical excision is recommended. 2. Screening mammogram in April 2022, last screening study dated 10/30/2019, if surgical excision is performed and no malignancy detected.(Code:[id number]) BI-RADS CATEGORY  2: Benign. Electronically Signed   By: Amie Portland M.D.   On: 01/25/2020 13:41   MM Breast Surgical Specimen  Result Date: 02/07/2020 CLINICAL DATA:  Status post surgical excision of the left breast. EXAM: SPECIMEN RADIOGRAPH OF THE LEFT BREAST COMPARISON:  Previous exam(s). FINDINGS: Status post excision of the left breast. The radioactive seed and biopsy marker clip are present, completely intact, and were marked for pathology. IMPRESSION: Specimen radiograph of the left breast. Electronically Signed   By: Baird Lyons M.D.   On: 02/07/2020 11:26   MM LT RADIOACTIVE SEED LOC MAMMO GUIDE  Result Date: 02/06/2020  CLINICAL DATA:  Pre surgical excision localization of recently biopsied left breast atypical ductal hyperplasia and flat epithelial atypia with calcifications. EXAM: MAMMOGRAPHIC GUIDED RADIOACTIVE SEED LOCALIZATION OF THE LEFT BREAST COMPARISON:  Previous exam(s). FINDINGS: Patient presents for radioactive seed localization prior to left breast excision. I met with the patient and we discussed the procedure of seed localization including benefits and alternatives. We discussed the high likelihood of a successful procedure. We discussed the risks of the procedure including infection, bleeding, tissue injury and further  surgery. We discussed the low dose of radioactivity involved in the procedure. Informed, written consent was given. The usual time-out protocol was performed immediately prior to the procedure. Using mammographic guidance, sterile technique, 1% lidocaine and an I-125 radioactive seed, the recently placed coil shaped biopsy marker clip and residual calcifications in the lower inner quadrant of the left breast were localized using a lateral approach. The follow-up mammogram images confirm the seed in the expected location and were marked for Dr. Georgette Dover. Follow-up survey of the patient confirms presence of the radioactive seed. Order number of I-125 seed:  [id number]. Total activity:  0.253 mCi reference Date: 01/18/2020 The patient tolerated the procedure well and was released from the [facility]. She was given instructions regarding seed removal. IMPRESSION: Radioactive seed localization left breast. No apparent complications. Electronically Signed   By: Claudie Revering M.D.   On: 02/06/2020 14:52    The Tyrer-Cisick score below is based only in the diagnosis of ADH; risk is greater with DCIS   ELIGIBLE FOR AVAILABLE RESEARCH PROTOCOL: AET  ASSESSMENT: 49 y.o. [place] woman status post left breast lower inner quadrant biopsy 11/11/2019 showing atypical ductal hyperplasia  (a) breast MRI 01/25/2020 shows no mass or abnormal enhancement.  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 Specifically we discussed the biology of her breast cancer, its diagnosis, staging, treatment  options and prognosis. Rochanda understands that in noninvasive  ductal carcinoma, also called ductal carcinoma in situ ("DCIS") the breast cancer cells remain trapped in the ducts were they started. They cannot travel to a vital organ. For that reason these cancers in themselves are not life-threatening.  If the whole breast is removed then all the ducts are removed and since the cancer cells are trapped in the ducts, the cure rate with mastectomy for noninvasive breast cancer is approximately 99%. Nevertheless we recommend lumpectomy, because there is no survival advantage to mastectomy and because the cosmetic result is generally superior with breast conservation.  Since the patient is keeping her breasts, there will be some risk of recurrence. The recurrence can only be in the same breast since, again, the cells are trapped in the ducts. There is no connection from one breast to the other. The risk of local recurrence is cut by more than half with radiation, which is standard in this situation.  Zoiee has many questions and qualms regarding radiation.  She has been reading on the Oncotype test for DCIS and she will discuss that with the radiation physician.  In estrogen receptor positive cancers like Ira is s, anti-estrogens can also be considered. They will further reduce the risk of recurrence by one half. In  addition anti-estrogens will lower the risk of a new breast cancer developing in either breast, also by one half. That risk approaches 1% per year.   Accordingly the standard of care for her situation is surgery, followed by radiation, then a discussion of anti-estrogens.  Madden is also interested in genetics testing.  I have sent an inquiry to one of our counselors to see if she would qualify for that test.  Quite aside from the ductal carcinoma in situ issue, Hilma is at high risk of developing another breast cancer in the future.  That risk in her case is likely in the 48 to 50% range.  We had a discussion regarding risk reduction with tamoxifen and  also intensified screening with yearly MRIs in addition to the yearly mammogram that she normally has.  What this means is that even if she decides to have no radiation and no tamoxifen for this ductal carcinoma in situ (which is low risk), she really needs to consider her high lifetime risk of developing another breast cancer, and that half of those breast cancers would be invasive.  Amarie has a good understanding of the overall plan. She agrees with it. She knows the goal of treatment in her case is cure. She will call with any problems that may develop before her next visit here.  Total encounter time 70 minutes.Sarajane Jews C. Kaisey Huseby, MD 02/19/2020 6:06 PM Medical Oncology and Hematology [facility] [place], [place] [address] Tel. [phone]    Fax. [phone]   This document serves as a record of services personally performed by Lurline Del, MD. It was created on his behalf by Wilburn Mylar, a trained medical scribe. The creation of this record is based on the scribe's personal observations and the provider's statements to them.   I, Lurline Del MD, have reviewed the above documentation for accuracy and completeness, and I agree with the above.    *Total Encounter Time as defined by the Centers for Medicare and Medicaid Services includes, in addition to the face-to-face time of a patient visit (documented in the note above) non-face-to-face time: obtaining and reviewing outside history, ordering and reviewing medications, tests or procedures, care coordination (communications with other health care professionals or caregivers) and documentation in the medical record.

## 2020-02-19 ENCOUNTER — Inpatient Hospital Stay: Payer: BC Managed Care – PPO

## 2020-02-19 ENCOUNTER — Other Ambulatory Visit: Payer: Self-pay

## 2020-02-19 ENCOUNTER — Inpatient Hospital Stay: Payer: BC Managed Care – PPO | Attending: Oncology | Admitting: Oncology

## 2020-02-19 DIAGNOSIS — Z170 Estrogen receptor positive status [ER+]: Secondary | ICD-10-CM | POA: Insufficient documentation

## 2020-02-19 DIAGNOSIS — Z9189 Other specified personal risk factors, not elsewhere classified: Secondary | ICD-10-CM

## 2020-02-19 DIAGNOSIS — D509 Iron deficiency anemia, unspecified: Secondary | ICD-10-CM | POA: Diagnosis not present

## 2020-02-19 DIAGNOSIS — C50312 Malignant neoplasm of lower-inner quadrant of left female breast: Secondary | ICD-10-CM | POA: Diagnosis not present

## 2020-02-19 DIAGNOSIS — N6092 Unspecified benign mammary dysplasia of left breast: Secondary | ICD-10-CM

## 2020-02-19 DIAGNOSIS — C50919 Malignant neoplasm of unspecified site of unspecified female breast: Secondary | ICD-10-CM

## 2020-02-19 DIAGNOSIS — F418 Other specified anxiety disorders: Secondary | ICD-10-CM | POA: Diagnosis not present

## 2020-02-19 DIAGNOSIS — R922 Inconclusive mammogram: Secondary | ICD-10-CM | POA: Diagnosis not present

## 2020-02-19 LAB — CMP (CANCER CENTER ONLY)
ALT: 18 U/L (ref 0–44)
AST: 19 U/L (ref 15–41)
Albumin: 4.1 g/dL (ref 3.5–5.0)
Alkaline Phosphatase: 54 U/L (ref 38–126)
Anion gap: 8 (ref 5–15)
BUN: 8 mg/dL (ref 6–20)
CO2: 28 mmol/L (ref 22–32)
Calcium: 10.2 mg/dL (ref 8.9–10.3)
Chloride: 105 mmol/L (ref 98–111)
Creatinine: 0.85 mg/dL (ref 0.44–1.00)
GFR, Est AFR Am: >60 mL/min (ref >60)
GFR, Estimated: >60 mL/min (ref >60)
Glucose, Bld: 96 mg/dL (ref 70–99)
Potassium: 3.8 mmol/L (ref 3.5–5.1)
Sodium: 141 mmol/L (ref 135–145)
Total Bilirubin: 0.9 mg/dL (ref 0.3–1.2)
Total Protein: 6.9 g/dL (ref 6.5–8.1)

## 2020-02-19 LAB — CBC WITH DIFFERENTIAL (CANCER CENTER ONLY)
Abs Immature Granulocytes: 0.01 10*3/uL (ref 0.00–0.07)
Basophils Absolute: 0 10*3/uL (ref 0.0–0.1)
Basophils Relative: 0 %
Eosinophils Absolute: 0.2 10*3/uL (ref 0.0–0.5)
Eosinophils Relative: 3 %
HCT: 36.2 % (ref 36.0–46.0)
Hemoglobin: 12.1 g/dL (ref 12.0–15.0)
Immature Granulocytes: 0 %
Lymphocytes Relative: 39 %
Lymphs Abs: 3.1 10*3/uL (ref 0.7–4.0)
MCH: 29.7 pg (ref 26.0–34.0)
MCHC: 33.4 g/dL (ref 30.0–36.0)
MCV: 88.9 fL (ref 80.0–100.0)
Monocytes Absolute: 0.6 10*3/uL (ref 0.1–1.0)
Monocytes Relative: 7 %
Neutro Abs: 4.1 10*3/uL (ref 1.7–7.7)
Neutrophils Relative %: 51 %
Platelet Count: 236 10*3/uL (ref 150–400)
RBC: 4.07 MIL/uL (ref 3.87–5.11)
RDW: 12.2 % (ref 11.5–15.5)
WBC Count: 8.1 10*3/uL (ref 4.0–10.5)
nRBC: 0 % (ref 0.0–0.2)

## 2020-02-20 ENCOUNTER — Telehealth: Payer: Self-pay

## 2020-02-20 ENCOUNTER — Telehealth: Payer: Self-pay | Admitting: Oncology

## 2020-02-20 ENCOUNTER — Encounter: Payer: Self-pay | Admitting: *Deleted

## 2020-02-20 NOTE — Telephone Encounter (Signed)
This nurse called pt regarding family hx, as she found out information from family.  Pt states her paternal grandmother had BRCA in the 1970's and had a double mastectomy. Pt states she is unsure about other family hx as her paternal aunts are deceased and their causes of death are unknown. Pt has 2 daughters and voices concern for their future, asking if she would be eligible for genetic testing. Informed pt if she is eligible we will consult wih genetic counseling. Pt verbalized thanks and understanding.

## 2020-02-20 NOTE — Telephone Encounter (Signed)
Scheduled appts per 8/9 los. Pt confirmed appt date and time.

## 2020-02-21 ENCOUNTER — Telehealth: Payer: Self-pay | Admitting: Oncology

## 2020-02-21 NOTE — Telephone Encounter (Signed)
Scheduled appt per 8/10 sch msg - pt is aware of apt date and time

## 2020-02-22 ENCOUNTER — Telehealth: Payer: Self-pay | Admitting: Licensed Clinical Social Worker

## 2020-02-22 NOTE — Telephone Encounter (Signed)
[facility] °Clinical Social Work ° °Clinical Social Worker attempted to contact patient by phone  to offer support and assess for needs in patient with newly diagnosed breast cancer. No answer. Left VM with direct contact information. ° ° ° ° °Jenisa Monty E, LCSW  °Clinical Social Worker °[facility] °      ° ° °

## 2020-02-26 ENCOUNTER — Encounter: Payer: Self-pay | Admitting: *Deleted

## 2020-02-26 NOTE — Telephone Encounter (Signed)
[facility] CSW Progress Note  [facility] representative spoke with patient by phone to introduce self and services. Patient lives with her husband and two daughters (ages 38 & 32). No resource needs at this time. CSW explained support programs, but patient unable to participate due to time of groups.   Patient currently only minimally telling daughters about what is happening and not using the word "cancer". CSW discussed with patient and offered to assist with how to have these conversations and different resources online if patient needs later.    Edwinna Areola Ples Trudel , LCSW

## 2020-03-02 ENCOUNTER — Other Ambulatory Visit: Payer: Self-pay | Admitting: Oncology

## 2020-03-06 ENCOUNTER — Inpatient Hospital Stay (HOSPITAL_BASED_OUTPATIENT_CLINIC_OR_DEPARTMENT_OTHER): Payer: BC Managed Care – PPO | Admitting: Genetic Counselor

## 2020-03-06 ENCOUNTER — Encounter: Payer: Self-pay | Admitting: Genetic Counselor

## 2020-03-06 ENCOUNTER — Other Ambulatory Visit: Payer: Self-pay

## 2020-03-06 ENCOUNTER — Inpatient Hospital Stay: Payer: BC Managed Care – PPO

## 2020-03-06 ENCOUNTER — Other Ambulatory Visit: Payer: Self-pay | Admitting: Genetic Counselor

## 2020-03-06 DIAGNOSIS — Z800 Family history of malignant neoplasm of digestive organs: Secondary | ICD-10-CM | POA: Insufficient documentation

## 2020-03-06 DIAGNOSIS — Z170 Estrogen receptor positive status [ER+]: Secondary | ICD-10-CM

## 2020-03-06 DIAGNOSIS — C50312 Malignant neoplasm of lower-inner quadrant of left female breast: Secondary | ICD-10-CM

## 2020-03-06 DIAGNOSIS — Z809 Family history of malignant neoplasm, unspecified: Secondary | ICD-10-CM | POA: Diagnosis not present

## 2020-03-06 DIAGNOSIS — Z808 Family history of malignant neoplasm of other organs or systems: Secondary | ICD-10-CM | POA: Insufficient documentation

## 2020-03-06 DIAGNOSIS — Z803 Family history of malignant neoplasm of breast: Secondary | ICD-10-CM | POA: Diagnosis not present

## 2020-03-06 LAB — GENETIC SCREENING ORDER

## 2020-03-06 NOTE — Progress Notes (Signed)
REFERRING PROVIDER: Chauncey Cruel, MD [address] [place],  [place] [address]  PRIMARY PROVIDER:  Aloha Gell, MD  PRIMARY REASON FOR VISIT:  1. Malignant neoplasm of lower-inner quadrant of left breast in female, estrogen receptor positive ([place])   2. Family history of breast cancer   3. Family history of pancreatic cancer   4. Family history of melanoma      HISTORY OF PRESENT ILLNESS:   Karen Pruitt, a 49 y.o. female, was seen for a [facility] cancer genetics consultation at the request of Dr. Jana Hakim due to a personal and family history of cancer.  Karen Pruitt presents to clinic today to discuss the possibility of a hereditary predisposition to cancer, genetic testing, and to further clarify her future cancer risks, as well as potential cancer risks for family members.   In May 2021, at the age of 83, Karen Pruitt was diagnosed with ADH of the left breast. The treatment plan included lumpectomy.  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  She has an appointment in radiology next week.     CANCER HISTORY:  Oncology History  Malignant neoplasm of lower-inner quadrant of left breast in female, estrogen receptor positive ([place])  02/07/2020 Cancer Staging   Staging form: Breast, AJCC 8th Edition - Pathologic stage from 02/07/2020: Stage 0 (pTis (DCIS), pN0, cM0, ER+, PR+) - Signed by Gardenia Phlegm, NP on 02/21/2020   02/19/2020 Initial Diagnosis   Malignant neoplasm of lower-inner quadrant of left breast in female, estrogen receptor positive ([place])   02/19/2020 Cancer Staging   Staging form: Breast, AJCC 8th Edition - Clinical: Stage 0 (cTis (DCIS), cN0, cM0, G1, ER+, PR+) - Signed by Chauncey Cruel, MD on 02/19/2020      RISK FACTORS:  Menarche was at age 38.  First live birth at age 81.  OCP use for approximately 12 years.  Ovaries intact: yes.  Hysterectomy: no.  Menopausal status: premenopausal.  HRT use: 0 years. Colonoscopy: no;  not examined. Mammogram within the last year: yes. Number of breast biopsies: 1. Up to date with pelvic exams: yes. Any excessive radiation exposure in the past: no  Past Medical History:  Diagnosis Date  . Anxiety   . Anxiety and depression 06/20/2012  . Depression   . Family history of breast cancer   . Family history of melanoma   . Family history of pancreatic cancer   . Interstitial cystitis   . Maternal anemia complicating pregnancy, childbirth, or the puerperium 06/20/2012   Mild IDA w/ superimposed ABL anemia  . Pelvic floor dysfunction   . Postpartum care following cesarean delivery 06/20/2012  . S/P cesarean section - elective primary 12/8 06/20/2012    Past Surgical History:  Procedure Laterality Date  . BREAST LUMPECTOMY WITH RADIOACTIVE SEED LOCALIZATION Left 02/07/2020   Procedure: LEFT BREAST LUMPECTOMY WITH RADIOACTIVE SEED LOCALIZATION;  Surgeon: Donnie Mesa, MD;  Location: [facility];  Service: General;  Laterality: Left;  . CESAREAN SECTION  06/19/2012   Procedure: CESAREAN SECTION;  Surgeon: Claiborne Billings A. Pamala Hurry, MD;  Location: [facility];  Service: Obstetrics;  Laterality: N/A;  Primary Cesarean Section   . CESAREAN SECTION WITH BILATERAL TUBAL LIGATION N/A 09/05/2013   Procedure: CESAREAN SECTION WITH BILATERAL TUBAL LIGATION;  Surgeon: Lovenia Kim, MD;  Location: [facility];  Service: Obstetrics;  Laterality: N/A;  . CYSTOSCOPY WITH HYDRODISTENSION AND BIOPSY    . TOE SURGERY      Social History   Socioeconomic  History  . Marital status: Married    Spouse name: Not on file  . Number of children: Not on file  . Years of education: Not on file  . Highest education level: Not on file  Occupational History  . Not on file  Tobacco Use  . Smoking status: Never Smoker  . Smokeless tobacco: Never Used  Vaping Use  . Vaping Use: Never used  Substance and Sexual Activity  . Alcohol use: Yes    Comment: occas  . Drug use: No  . Sexual activity:  Yes    Birth control/protection: Surgical    Comment: s/p BTL  Other Topics Concern  . Not on file  Social History Narrative  . Not on file   Social Determinants of Health   Financial Resource Strain:   . Difficulty of Paying Living Expenses: Not on file  Food Insecurity:   . Worried About Charity fundraiser in the Last Year: Not on file  . Ran Out of Food in the Last Year: Not on file  Transportation Needs:   . Lack of Transportation (Medical): Not on file  . Lack of Transportation (Non-Medical): Not on file  Physical Activity:   . Days of Exercise per Week: Not on file  . Minutes of Exercise per Session: Not on file  Stress:   . Feeling of Stress : Not on file  Social Connections:   . Frequency of Communication with Friends and Family: Not on file  . Frequency of Social Gatherings with Friends and Family: Not on file  . Attends Religious Services: Not on file  . Active Member of Clubs or Organizations: Not on file  . Attends Archivist Meetings: Not on file  . Marital Status: Not on file     FAMILY HISTORY:  We obtained a detailed, 4-generation family history.  Significant diagnoses are listed below: Family History  Problem Relation Age of Onset  . Arthritis Mother   . Bleeding Disorder Mother   . Depression Mother   . Hypertension Mother   . Alcohol abuse Father   . Cerebrovascular Accident Father   . Depression Father   . Heart disease Father   . Pancreatic cancer Paternal Aunt   . Melanoma Maternal Grandmother        in the eye  . Liver disease Maternal Grandfather   . Breast cancer Paternal Grandmother        dbl mastectomy, d in late 12s-60s  . Dementia Paternal Grandfather   . Other Paternal Aunt        COVID  . Dementia Paternal Aunt     The patient has two daughters who are cancer free.  She has a sister who is cancer free.  Both parents are living.  The patient's mother has one brother who is cancer free.  Her parents are deceased.  Her  mother had a melanoma in her eye.  The patient's father is living.  He had two sisters and a brother.  One sister had probable pancreatic cancer, the other sister died of COVID and dementia, and it is unknown about the brother.  The paternal grandparents are deceased.  The grandfather had dementia and the grandmother had metastatic breast cancer after a double mastectomy and died in her late 83's-60's.  Ms. Whitebread is unaware of previous family history of genetic testing for hereditary cancer risks. Patient's maternal ancestors are of [place] and [place] descent, and paternal ancestors are of [place] and [place]/Swedish descent. There is  no reported Ashkenazi Jewish ancestry. There is no known consanguinity.  GENETIC COUNSELING ASSESSMENT: Ms. Wilborn is a 49 y.o. female with a personal and family history of cancer which is somewhat suggestive of a hereditary cancer syndrome and predisposition to cancer given the combination of cancer and the young age of onset. We, therefore, discussed and recommended the following at today's visit.   DISCUSSION: We discussed that 5 - 10% of breast cancer is hereditary, with most cases associated with BRCA mutations.  There are other genes that can be associated with hereditary breast cancer syndromes.  These include ATM, CHEK2 and PALB2.  We discussed that testing is beneficial for several reasons including knowing how to follow individuals after completing their treatment, identifying whether potential treatment options such as PARP inhibitors would be beneficial, and understand if other family members could be at risk for cancer and allow them to undergo genetic testing.   We reviewed the characteristics, features and inheritance patterns of hereditary cancer syndromes. We also discussed genetic testing, including the appropriate family members to test, the process of testing, insurance coverage and turn-around-time for results. We discussed the implications of a  negative, positive, carrier and/or variant of uncertain significant result. We recommended Ms. Furry pursue genetic testing for the BRCA STAT gene panel. Once completed, we will reflex to the multi-cancer gene panel.  The Multi-Gene Panel offered by [facility] includes sequencing and/or deletion duplication testing of the following 85 genes: AIP, ALK, APC, ATM, AXIN2,BAP1,  BARD1, BLM, BMPR1A, BRCA1, BRCA2, BRIP1, CASR, CDC73, CDH1, CDK4, CDKN1B, CDKN1C, CDKN2A (p14ARF), CDKN2A (p16INK4a), CEBPA, CHEK2, CTNNA1, DICER1, DIS3L2, EGFR (c.2369C>T, p.Thr790Met variant only), EPCAM (Deletion/duplication testing only), FH, FLCN, GATA2, GPC3, GREM1 (Promoter region deletion/duplication testing only), HOXB13 (c.251G>A, p.Gly84Glu), HRAS, KIT, MAX, MEN1, MET, MITF (c.952G>A, p.Glu318Lys variant only), MLH1, MSH2, MSH3, MSH6, MUTYH, NBN, NF1, NF2, NTHL1, PALB2, PDGFRA, PHOX2B, PMS2, POLD1, POLE, POT1, PRKAR1A, PTCH1, PTEN, RAD50, RAD51C, RAD51D, RB1, RECQL4, RET, RNF43, RUNX1, SDHAF2, SDHA (sequence changes only), SDHB, SDHC, SDHD, SMAD4, SMARCA4, SMARCB1, SMARCE1, STK11, SUFU, TERC, TERT, TMEM127, TP53, TSC1, TSC2, VHL, WRN and WT1.    Based on Ms. Dolores's personal and family history of cancer, she meets medical criteria for genetic testing. Despite that she meets criteria, she may still have an out of pocket cost. We discussed that if her out of pocket cost for testing is over $100, the laboratory will call and confirm whether she wants to proceed with testing.  If the out of pocket cost of testing is less than $100 she will be billed by the genetic testing laboratory.   PLAN: After considering the risks, benefits, and limitations, Ms. Broner provided informed consent to pursue genetic testing and the blood sample was sent to [facility] for analysis of the STAT and Multi-cancer gene panel. Results should be available within approximately 2-3 weeks' time, at which point they will be disclosed by telephone to  Ms. Card, as will any additional recommendations warranted by these results. Ms. Holck will receive a summary of her genetic counseling visit and a copy of her results once available. This information will also be available in Epic.   Lastly, we encouraged Ms. Ehlert to remain in contact with cancer genetics annually so that we can continuously update the family history and inform her of any changes in cancer genetics and testing that may be of benefit for this family.   Ms. Orban questions were answered to her satisfaction today. Our contact information was provided should additional questions or  concerns arise. Thank you for the referral and allowing [place] to share in the care of your patient.   Magalene Mclear P. Florene Glen, [place], [facility] Licensed, Insurance risk surveyor [email or web address] phone: [phone]  The patient was seen for a total of 35 minutes in face-to-face genetic counseling.  This patient was discussed with Drs. Magrinat, Lindi Adie and/or Burr Medico who agrees with the above.    _______________________________________________________________________ For Office Staff:  Number of people involved in session: 1 Was an Intern/ student involved with case: no

## 2020-03-11 NOTE — Progress Notes (Signed)
Patient here for a consult with Dr. Sondra Come.  Name: Karen Pruitt MRN: [id number]  Date: 03/13/2020  DOB: 16-Nov-1970  CC:Karen Gell, MD  Magrinat, Virgie Dad, MD   REFERRING PHYSICIAN: Magrinat, Virgie Dad, MD  DIAGNOSIS: There were no encounter diagnoses.  Stage 0, Left Breast LIQ, DCIS, ER+ / PR+, Grade 1  HISTORY OF PRESENT ILLNESS::Karen Pruitt is a 49 y.o. female who is seen as a courtesy of Dr. Jana Hakim for an opinion concerning radiation therapy as part of management for her recently diagnosed left breast cancer. Today, she is accompanied by . She had routine screening mammography on 10/30/2019 that showed calcifications in the left breast. She underwent unilateral diagnostic mammography and left breast ultrasonography on 11/10/2019 that showed a suspicious 1.0 cm group of calcifications in the lower inner quadrant of the left breast. It also showed extremely dense breast parenchyma. It was negative for left axillary lymphadenopathy. Of note, she reported experiencing some intermittent dark-colored nipple discharge over the last few years.  Biopsy on 11/27/2019 revealed atypical ductal hyperplasia and flat epithelial atypia with calcifications in addition to background breast parenchyma with fibrocystic change and pseudoangiomatous stromal hyperplasia.   MRI of bilateral breasts on 01/25/2020 showed small bilateral breast cysts without evidence of breast malignancy.  The patient underwent a left lumpectomy on 02/07/2020 that was performed by Dr. Georgette Dover. Pathology from the procedure revealed low-grade ductal carcinoma in-situ with calcifications. It also showed fibrocystic changes with sclerosing adenosis and calcifications. Margins were uninvolved. Prognostic indicators were significant for estrogen receptor, 95% positive and progesterone receptor, 10% positive, both with moderate staining intensities.  The patient was referred to Dr. Jana Hakim and was seen in consultation on 02/19/2020.  It was recommended that she proceed with genetic testing, adjuvant radiation therapy, and anti-estrogen therapy.  Genetic testing was completed on 03/06/2020. Final results are pending.   PREVIOUS RADIATION THERAPY: No  PAST MEDICAL HISTORY:  Past Medical History:  Diagnosis Date  . Anxiety   . Anxiety and depression 06/20/2012  . Depression   . Family history of breast cancer   . Family history of melanoma   . Family history of pancreatic cancer   . Interstitial cystitis   . Maternal anemia complicating pregnancy, childbirth, or the puerperium 06/20/2012   Mild IDA w/ superimposed ABL anemia  . Pelvic floor dysfunction   . Postpartum care following cesarean delivery 06/20/2012  . S/P cesarean section - elective primary 12/8 06/20/2012    PAST SURGICAL HISTORY: Past Surgical History:  Procedure Laterality Date  . BREAST LUMPECTOMY WITH RADIOACTIVE SEED LOCALIZATION Left 02/07/2020   Procedure: LEFT BREAST LUMPECTOMY WITH RADIOACTIVE SEED LOCALIZATION;  Surgeon: Donnie Mesa, MD;  Location: [facility];  Service: General;  Laterality: Left;  . CESAREAN SECTION  06/19/2012   Procedure: CESAREAN SECTION;  Surgeon: Claiborne Billings A. Pamala Hurry, MD;  Location: [facility];  Service: Obstetrics;  Laterality: N/A;  Primary Cesarean Section   . CESAREAN SECTION WITH BILATERAL TUBAL LIGATION N/A 09/05/2013   Procedure: CESAREAN SECTION WITH BILATERAL TUBAL LIGATION;  Surgeon: Lovenia Kim, MD;  Location: [facility];  Service: Obstetrics;  Laterality: N/A;  . CYSTOSCOPY WITH HYDRODISTENSION AND BIOPSY    . TOE SURGERY        Lymphedema issues, if any: no  Pain issues, if any:  no  SAFETY ISSUES:  Prior radiation? no  Pacemaker/ICD? no  Possible current pregnancy? no  Is the patient on methotrexate? no  Current Complaints / other details:   Patient  is here with her husband.  BP 120/68 (BP Location: Right Arm, Patient Position: Sitting)   Pulse 65   Temp 97.9 F (36.6 C)  (Oral)   Resp 18   Ht 4\' 11"  (1.499 m)   Wt 106 lb (48.1 kg)   SpO2 100%   BMI 21.41 kg/m    Wt Readings from Last 3 Encounters:  03/13/20 106 lb (48.1 kg)  02/19/20 108 lb (49 kg)  02/07/20 106 lb 0.7 oz (48.1 kg)       De Burrs, RN 03/11/2020,2:17 PM

## 2020-03-11 NOTE — Progress Notes (Signed)
Radiation Oncology         [phone] ________________________________  Initial Outpatient Consultation  Name: Karen Pruitt MRN: [id number]  Date: 03/13/2020  DOB: [birth year]  CC:Aloha Gell, MD  Magrinat, Virgie Dad, MD   REFERRING PHYSICIAN: Magrinat, Virgie Dad, MD  DIAGNOSIS: The encounter diagnosis was Malignant neoplasm of lower-inner quadrant of left breast in female, estrogen receptor positive ([place]).  Stage 0, Left Breast LIQ, DCIS, ER+ / PR+, Grade 1  HISTORY OF PRESENT ILLNESS::Karen Pruitt is a 49 y.o. female who is seen as a courtesy of Dr. Jana Hakim for an opinion concerning radiation therapy as part of management for her recently diagnosed left breast cancer. Today, she is accompanied by her husband. She had routine screening mammography on 10/30/2019 that showed calcifications in the left breast. She underwent unilateral diagnostic mammography and left breast ultrasonography on 11/10/2019 that showed a suspicious 1.0 cm group of calcifications in the lower inner quadrant of the left breast. It also showed extremely dense breast parenchyma. It was negative for left axillary lymphadenopathy. Of note, she reported experiencing some intermittent dark-colored nipple discharge over the last few years.  Biopsy on 11/27/2019 revealed atypical ductal hyperplasia and flat epithelial atypia with calcifications in addition to background breast parenchyma with fibrocystic change and pseudoangiomatous stromal hyperplasia.  In the findings of atypical ductal hyperplasia,  lumpectomy was recommended  MRI of bilateral breasts on 01/25/2020 showed small bilateral breast cysts without evidence of breast malignancy.  The patient underwent a left lumpectomy on 02/07/2020 that was performed by Dr. Georgette Dover. Pathology from the procedure revealed low-grade ductal carcinoma in-situ with calcifications. It also showed fibrocystic changes with sclerosing adenosis and calcifications. Margins were  uninvolved. Prognostic indicators were significant for estrogen receptor, 95% positive and progesterone receptor, 10% positive, both with moderate staining intensities.  The patient was referred to Dr. Jana Hakim and was seen in consultation on 02/19/2020. It was recommended that she proceed with genetic testing, adjuvant radiation therapy, and anti-estrogen therapy.  Genetic testing was completed on 03/06/2020. Final results are pending.  In discussion today the patient reports  she would not proceed with adjuvant hormonal therapy given the potential side effects.  PREVIOUS RADIATION THERAPY: No  PAST MEDICAL HISTORY:  Past Medical History:  Diagnosis Date  . Anxiety   . Anxiety and depression 06/20/2012  . Depression   . Family history of breast cancer   . Family history of melanoma   . Family history of pancreatic cancer   . Interstitial cystitis   . Maternal anemia complicating pregnancy, childbirth, or the puerperium 06/20/2012   Mild IDA w/ superimposed ABL anemia  . Pelvic floor dysfunction   . Postpartum care following cesarean delivery 06/20/2012  . S/P cesarean section - elective primary 12/8 06/20/2012    PAST SURGICAL HISTORY: Past Surgical History:  Procedure Laterality Date  . BREAST LUMPECTOMY WITH RADIOACTIVE SEED LOCALIZATION Left 02/07/2020   Procedure: LEFT BREAST LUMPECTOMY WITH RADIOACTIVE SEED LOCALIZATION;  Surgeon: Donnie Mesa, MD;  Location: [facility];  Service: General;  Laterality: Left;  . CESAREAN SECTION  06/19/2012   Procedure: CESAREAN SECTION;  Surgeon: Claiborne Billings A. Pamala Hurry, MD;  Location: [facility];  Service: Obstetrics;  Laterality: N/A;  Primary Cesarean Section   . CESAREAN SECTION WITH BILATERAL TUBAL LIGATION N/A 09/05/2013   Procedure: CESAREAN SECTION WITH BILATERAL TUBAL LIGATION;  Surgeon: Lovenia Kim, MD;  Location: [facility];  Service: Obstetrics;  Laterality: N/A;  . CYSTOSCOPY WITH HYDRODISTENSION AND BIOPSY    .  TOE SURGERY        FAMILY HISTORY:  Family History  Problem Relation Age of Onset  . Arthritis Mother   . Bleeding Disorder Mother   . Depression Mother   . Hypertension Mother   . Alcohol abuse Father   . Cerebrovascular Accident Father   . Depression Father   . Heart disease Father   . Pancreatic cancer Paternal Aunt   . Melanoma Maternal Grandmother        in the eye  . Liver disease Maternal Grandfather   . Breast cancer Paternal Grandmother        dbl mastectomy, d in late 13s-60s  . Dementia Paternal Grandfather   . Other Paternal Aunt        COVID  . Dementia Paternal Aunt     SOCIAL HISTORY:  Social History   Tobacco Use  . Smoking status: Never Smoker  . Smokeless tobacco: Never Used  Vaping Use  . Vaping Use: Never used  Substance Use Topics  . Alcohol use: Yes    Comment: occas  . Drug use: No    ALLERGIES: No Known Allergies  MEDICATIONS:  Current Outpatient Medications  Medication Sig Dispense Refill  . tranexamic acid (LYSTEDA) 650 MG TABS tablet Take 2 tablets (1,300 mg total) by mouth 3 (three) times daily. 30 tablet    No current facility-administered medications for this encounter.    REVIEW OF SYSTEMS:  A 10+ POINT REVIEW OF SYSTEMS WAS OBTAINED including neurology, dermatology, psychiatry, cardiac, respiratory, lymph, extremities, GI, GU, musculoskeletal, constitutional, reproductive, HEENT.  Minimal discomfort in the left breast at this time.  She is not taking any pain medications.  No nipple discharge or bleeding   PHYSICAL EXAM:  height is 4\' 11"  (1.499 m) and weight is 106 lb (48.1 kg). Her oral temperature is 97.9 F (36.6 C). Her blood pressure is 120/68 and her pulse is 65. Her respiration is 18 and oxygen saturation is 100%.   General: Alert and oriented, in no acute distress HEENT: Head is normocephalic. Extraocular movements are intact.  Neck: Neck is supple, no palpable cervical or supraclavicular lymphadenopathy. Heart: Regular in rate and  rhythm with no murmurs, rubs, or gallops. Chest: Clear to auscultation bilaterally, with no rhonchi, wheezes, or rales. Abdomen: Soft, nontender, nondistended, with no rigidity or guarding. Extremities: No cyanosis or edema. Lymphatics: see Neck Exam Skin: No concerning lesions. Musculoskeletal: symmetric strength and muscle tone throughout. Neurologic: Cranial nerves II through XII are grossly intact. No obvious focalities. Speech is fluent. Coordination is intact. Psychiatric: Judgment and insight are intact. Affect is appropriate. Right breast: No palpable mass, nipple discharge, or bleeding.  Left breast: Well-healing periareolar scar.  No signs of drainage or infection.  No dominant mass appreciated in the breast.  Fibrocystic changes throughout both breasts  ECOG = 1  0 - Asymptomatic (Fully active, able to carry on all predisease activities without restriction)  1 - Symptomatic but completely ambulatory (Restricted in physically strenuous activity but ambulatory and able to carry out work of a light or sedentary nature. For example, light housework, office work)  2 - Symptomatic, <50% in bed during the day (Ambulatory and capable of all self care but unable to carry out any work activities. Up and about more than 50% of waking hours)  3 - Symptomatic, >50% in bed, but not bedbound (Capable of only limited self-care, confined to bed or chair 50% or more of waking hours)  4 - Bedbound (Completely disabled.  Cannot carry on any self-care. Totally confined to bed or chair)  5 - Death   Eustace Pen MM, Creech RH, Tormey DC, et al. [phone]). "Toxicity and response criteria of the [facility]". [place] Oncol. 5 (6): 649-55  LABORATORY DATA:  Lab Results  Component Value Date   WBC 8.1 02/19/2020   HGB 12.1 02/19/2020   HCT 36.2 02/19/2020   MCV 88.9 02/19/2020   PLT 236 02/19/2020   NEUTROABS 4.1 02/19/2020   Lab Results  Component Value Date   NA 141  02/19/2020   K 3.8 02/19/2020   CL 105 02/19/2020   CO2 28 02/19/2020   GLUCOSE 96 02/19/2020   CREATININE 0.85 02/19/2020   CALCIUM 10.2 02/19/2020      RADIOGRAPHY: No results found.    IMPRESSION: Stage 0, Left Breast LIQ, DCIS, ER+ / PR+, Grade 1  Patient was found to have a 1 cm area of low-grade ductal carcinoma in situ.  Surgical margins were clear with the closest margin being 8 mm, that being the inferior margin.    given the patient's young age and the fact that she is not proceeding with adjuvant hormonal therapy, I would strongly recommend radiation therapy as part of her overall management.   Today, I talked to the patient and husband about the findings and work-up thus far.  We discussed the natural history of breast cancer and general treatment, highlighting the role of radiotherapy in the management.  We discussed the available radiation techniques, and focused on the details of logistics and delivery.  We reviewed the anticipated acute and late sequelae associated with radiation in this setting.  The patient was encouraged to ask questions that I answered to the best of my ability.  A patient consent form was discussed and signed.  We retained a copy for our records.  The patient would like to proceed with radiation and will be scheduled for CT simulation.  PLAN: The patient would like to wait on results of her genetic testing prior to scheduling her simulation.  I have asked patient to call me once she has results from these studies.  If her genetic testing is positive then she may consider mastectomy or bilateral mastectomies.  Total time spent in this encounter was 60 minutes which included reviewing the patient's most recent mammogram/ultrasound, MRI of breasts, biopsy, lumpectomy, pathology reports, consultations, genetic testing, physical examination, and documentation.   ------------------------------------------------  Blair Promise, PhD, MD  This document  serves as a record of services personally performed by Gery Pray, MD. It was created on his behalf by Clerance Lav, a trained medical scribe. The creation of this record is based on the scribe's personal observations and the provider's statements to them. This document has been checked and approved by the attending provider.

## 2020-03-13 ENCOUNTER — Encounter: Payer: Self-pay | Admitting: Radiation Oncology

## 2020-03-13 ENCOUNTER — Ambulatory Visit
Admission: RE | Admit: 2020-03-13 | Discharge: 2020-03-13 | Disposition: A | Discharge status: Home / Self Care | Payer: BC Managed Care – PPO | Source: Ambulatory Visit | Attending: Radiation Oncology | Admitting: Radiation Oncology

## 2020-03-13 ENCOUNTER — Other Ambulatory Visit: Payer: Self-pay

## 2020-03-13 VITALS — BP 120/68 | HR 65 | Temp 97.9°F | Resp 18 | Ht 59.0 in | Wt 106.0 lb

## 2020-03-13 DIAGNOSIS — Z800 Family history of malignant neoplasm of digestive organs: Secondary | ICD-10-CM | POA: Diagnosis not present

## 2020-03-13 DIAGNOSIS — Z170 Estrogen receptor positive status [ER+]: Secondary | ICD-10-CM | POA: Insufficient documentation

## 2020-03-13 DIAGNOSIS — Z9889 Other specified postprocedural states: Secondary | ICD-10-CM | POA: Diagnosis not present

## 2020-03-13 DIAGNOSIS — C50312 Malignant neoplasm of lower-inner quadrant of left female breast: Secondary | ICD-10-CM | POA: Diagnosis not present

## 2020-03-13 DIAGNOSIS — Z803 Family history of malignant neoplasm of breast: Secondary | ICD-10-CM | POA: Diagnosis not present

## 2020-03-13 DIAGNOSIS — D0512 Intraductal carcinoma in situ of left breast: Secondary | ICD-10-CM | POA: Diagnosis not present

## 2020-03-13 DIAGNOSIS — N6092 Unspecified benign mammary dysplasia of left breast: Secondary | ICD-10-CM

## 2020-03-13 DIAGNOSIS — F418 Other specified anxiety disorders: Secondary | ICD-10-CM | POA: Diagnosis not present

## 2020-03-14 ENCOUNTER — Encounter: Payer: Self-pay | Admitting: Genetic Counselor

## 2020-03-14 ENCOUNTER — Telehealth: Payer: Self-pay | Admitting: Genetic Counselor

## 2020-03-14 DIAGNOSIS — Z1379 Encounter for other screening for genetic and chromosomal anomalies: Secondary | ICD-10-CM | POA: Insufficient documentation

## 2020-03-14 NOTE — Telephone Encounter (Signed)
Revealed negative genetic testing.  Discussed that we do not know why she has breast cancer or why there is cancer in the family. It could be due to a different gene that we are not testing, or maybe our current technology may not be able to pick something up.  It will be important for her to keep in contact with genetics to keep up with whether additional testing may be needed.   Revealed that there were two VUS identified.  This will not change medical management.

## 2020-03-15 ENCOUNTER — Ambulatory Visit: Payer: Self-pay | Admitting: Genetic Counselor

## 2020-03-15 DIAGNOSIS — Z1379 Encounter for other screening for genetic and chromosomal anomalies: Secondary | ICD-10-CM

## 2020-03-15 NOTE — Progress Notes (Signed)
HPI:  Ms. Bresee was previously seen in the [place] clinic due to a personal and family history of cancer and concerns regarding a hereditary predisposition to cancer. Please refer to our prior cancer genetics clinic note for more information regarding our discussion, assessment and recommendations, at the time. Ms. Milo recent genetic test results were disclosed to her, as were recommendations warranted by these results. These results and recommendations are discussed in more detail below.  CANCER HISTORY:  Oncology History  Malignant neoplasm of lower-inner quadrant of left breast in female, estrogen receptor positive ([place])  02/07/2020 Cancer Staging   Staging form: Breast, AJCC 8th Edition - Pathologic stage from 02/07/2020: Stage 0 (pTis (DCIS), pN0, cM0, ER+, PR+) - Signed by Gardenia Phlegm, NP on 02/21/2020   02/19/2020 Initial Diagnosis   Malignant neoplasm of lower-inner quadrant of left breast in female, estrogen receptor positive ([place])   02/19/2020 Cancer Staging   Staging form: Breast, AJCC 8th Edition - Clinical: Stage 0 (cTis (DCIS), cN0, cM0, G1, ER+, PR+) - Signed by Chauncey Cruel, MD on 02/19/2020   03/13/2020 Genetic Testing   Negative genetic testing.  CDC73 c.1333G>A and MSH6 c.1995B>C VUS identified on the multicancer panel. The Multi-Gene Panel offered by [facility] includes sequencing and/or deletion duplication testing of the following 85 genes: AIP, ALK, APC, ATM, AXIN2,BAP1,  BARD1, BLM, BMPR1A, BRCA1, BRCA2, BRIP1, CASR, CDC73, CDH1, CDK4, CDKN1B, CDKN1C, CDKN2A (p14ARF), CDKN2A (p16INK4a), CEBPA, CHEK2, CTNNA1, DICER1, DIS3L2, EGFR (c.2369C>T, p.Thr790Met variant only), EPCAM (Deletion/duplication testing only), FH, FLCN, GATA2, GPC3, GREM1 (Promoter region deletion/duplication testing only), HOXB13 (c.251G>A, p.Gly84Glu), HRAS, KIT, MAX, MEN1, MET, MITF (c.952G>A, p.Glu318Lys variant only), MLH1, MSH2, MSH3, MSH6, MUTYH, NBN, NF1, NF2, NTHL1,  PALB2, PDGFRA, PHOX2B, PMS2, POLD1, POLE, POT1, PRKAR1A, PTCH1, PTEN, RAD50, RAD51C, RAD51D, RB1, RECQL4, RET, RNF43, RUNX1, SDHAF2, SDHA (sequence changes only), SDHB, SDHC, SDHD, SMAD4, SMARCA4, SMARCB1, SMARCE1, STK11, SUFU, TERC, TERT, TMEM127, TP53, TSC1, TSC2, VHL, WRN and WT1.  The report date is March 13, 2020.     FAMILY HISTORY:  We obtained a detailed, 4-generation family history.  Significant diagnoses are listed below: Family History  Problem Relation Age of Onset  . Arthritis Mother   . Bleeding Disorder Mother   . Depression Mother   . Hypertension Mother   . Alcohol abuse Father   . Cerebrovascular Accident Father   . Depression Father   . Heart disease Father   . Pancreatic cancer Paternal Aunt   . Melanoma Maternal Grandmother        in the eye  . Liver disease Maternal Grandfather   . Breast cancer Paternal Grandmother        dbl mastectomy, d in late 29s-60s  . Dementia Paternal Grandfather   . Other Paternal Aunt        COVID  . Dementia Paternal Aunt     The patient has two daughters who are cancer free.  She has a sister who is cancer free.  Both parents are living.  The patient's mother has one brother who is cancer free.  Her parents are deceased.  Her mother had a melanoma in her eye.  The patient's father is living.  He had two sisters and a brother.  One sister had probable pancreatic cancer, the other sister died of COVID and dementia, and it is unknown about the brother.  The paternal grandparents are deceased.  The grandfather had dementia and the grandmother had metastatic breast cancer after a double mastectomy  and died in her late 50's-60's.  Ms. Holaway is unaware of previous family history of genetic testing for hereditary cancer risks. Patient's maternal ancestors are of [place] and [place] descent, and paternal ancestors are of [place] and [place]ish descent. There is no reported Ashkenazi Jewish ancestry. There is no known  consanguinity.   GENETIC TEST RESULTS: Genetic testing reported out on March 14, 2020 through the Multi- cancer panel found no pathogenic mutations. The Multi-Gene Panel offered by [facility] includes sequencing and/or deletion duplication testing of the following 85 genes: AIP, ALK, APC, ATM, AXIN2,BAP1,  BARD1, BLM, BMPR1A, BRCA1, BRCA2, BRIP1, CASR, CDC73, CDH1, CDK4, CDKN1B, CDKN1C, CDKN2A (p14ARF), CDKN2A (p16INK4a), CEBPA, CHEK2, CTNNA1, DICER1, DIS3L2, EGFR (c.2369C>T, p.Thr790Met variant only), EPCAM (Deletion/duplication testing only), FH, FLCN, GATA2, GPC3, GREM1 (Promoter region deletion/duplication testing only), HOXB13 (c.251G>A, p.Gly84Glu), HRAS, KIT, MAX, MEN1, MET, MITF (c.952G>A, p.Glu318Lys variant only), MLH1, MSH2, MSH3, MSH6, MUTYH, NBN, NF1, NF2, NTHL1, PALB2, PDGFRA, PHOX2B, PMS2, POLD1, POLE, POT1, PRKAR1A, PTCH1, PTEN, RAD50, RAD51C, RAD51D, RB1, RECQL4, RET, RNF43, RUNX1, SDHAF2, SDHA (sequence changes only), SDHB, SDHC, SDHD, SMAD4, SMARCA4, SMARCB1, SMARCE1, STK11, SUFU, TERC, TERT, TMEM127, TP53, TSC1, TSC2, VHL, WRN and WT1. The test report has been scanned into EPIC and is located under the Molecular Pathology section of the Results Review tab.  A portion of the result report is included below for reference.     We discussed with Ms. Catino that because current genetic testing is not perfect, it is possible there may be a gene mutation in one of these genes that current testing cannot detect, but that chance is small.  We also discussed, that there could be another gene that has not yet been discovered, or that we have not yet tested, that is responsible for the cancer diagnoses in the family. It is also possible there is a hereditary cause for the cancer in the family that Ms. Weill did not inherit and therefore was not identified in her testing.  Therefore, it is important to remain in touch with cancer genetics in the future so that we can continue to offer Ms. Melroy  the most up to date genetic testing.   Genetic testing did identify two Variants of uncertain significance (VUS) - one in the CDC73 gene called c.1333G>A and a second in the MSH6 gene called c.1995G>C.  At this time, it is unknown if these variants are associated with increased cancer risk or if they are normal findings, but most variants such as these get reclassified to being inconsequential. They should not be used to make medical management decisions. With time, we suspect the lab will determine the significance of these variants, if any. If we do learn more about them, we will try to contact Ms. Turay to discuss it further. However, it is important to stay in touch with [place] periodically and keep the address and phone number up to date.  ADDITIONAL GENETIC TESTING: We discussed with Ms. Grauberger that her genetic testing was fairly extensive.  If there are genes identified to increase cancer risk that can be analyzed in the future, we would be happy to discuss and coordinate this testing at that time.    CANCER SCREENING RECOMMENDATIONS: Ms. Conti test result is considered negative (normal).  This means that we have not identified a hereditary cause for her personal and family history of cancer at this time. Most cancers happen by chance and this negative test suggests that her cancer may fall into this category.    While  reassuring, this does not definitively rule out a hereditary predisposition to cancer. It is still possible that there could be genetic mutations that are undetectable by current technology. There could be genetic mutations in genes that have not been tested or identified to increase cancer risk.  Therefore, it is recommended she continue to follow the cancer management and screening guidelines provided by her oncology and primary healthcare provider.   An individual's cancer risk and medical management are not determined by genetic test results alone. Overall cancer risk  assessment incorporates additional factors, including personal medical history, family history, and any available genetic information that may result in a personalized plan for cancer prevention and surveillance  RECOMMENDATIONS FOR FAMILY MEMBERS:  Individuals in this family might be at some increased risk of developing cancer, over the general population risk, simply due to the family history of cancer.  We recommended women in this family have a yearly mammogram beginning at age 8, or 45 years younger than the earliest onset of cancer, an annual clinical breast exam, and perform monthly breast self-exams. Women in this family should also have a gynecological exam as recommended by their primary provider. All family members should be referred for colonoscopy starting at age 73.  FOLLOW-UP: Lastly, we discussed with Ms. Koslosky that cancer genetics is a rapidly advancing field and it is possible that new genetic tests will be appropriate for her and/or her family members in the future. We encouraged her to remain in contact with cancer genetics on an annual basis so we can update her personal and family histories and let her know of advances in cancer genetics that may benefit this family.   Our contact number was provided. Ms. Corlett questions were answered to her satisfaction, and she knows she is welcome to call us at anytime with additional questions or concerns.   Roma Kayser, [place], [facility] Licensed, Certified Genetic Counselor [email or web address]

## 2020-03-19 ENCOUNTER — Encounter: Payer: Self-pay | Admitting: *Deleted

## 2020-03-25 ENCOUNTER — Ambulatory Visit
Admission: RE | Admit: 2020-03-25 | Discharge: 2020-03-25 | Disposition: A | Discharge status: Home / Self Care | Payer: BC Managed Care – PPO | Source: Ambulatory Visit | Attending: Radiation Oncology | Admitting: Radiation Oncology

## 2020-03-25 ENCOUNTER — Other Ambulatory Visit: Payer: Self-pay

## 2020-03-25 DIAGNOSIS — Z170 Estrogen receptor positive status [ER+]: Secondary | ICD-10-CM | POA: Diagnosis not present

## 2020-03-25 DIAGNOSIS — C50312 Malignant neoplasm of lower-inner quadrant of left female breast: Secondary | ICD-10-CM

## 2020-03-25 DIAGNOSIS — Z510 Encounter for antineoplastic radiation therapy: Secondary | ICD-10-CM | POA: Diagnosis not present

## 2020-03-25 DIAGNOSIS — D0512 Intraductal carcinoma in situ of left breast: Secondary | ICD-10-CM | POA: Diagnosis not present

## 2020-03-27 DIAGNOSIS — C50312 Malignant neoplasm of lower-inner quadrant of left female breast: Secondary | ICD-10-CM | POA: Diagnosis not present

## 2020-03-27 DIAGNOSIS — D0512 Intraductal carcinoma in situ of left breast: Secondary | ICD-10-CM | POA: Diagnosis not present

## 2020-03-27 DIAGNOSIS — Z170 Estrogen receptor positive status [ER+]: Secondary | ICD-10-CM | POA: Diagnosis not present

## 2020-03-27 DIAGNOSIS — Z510 Encounter for antineoplastic radiation therapy: Secondary | ICD-10-CM | POA: Diagnosis not present

## 2020-04-01 ENCOUNTER — Other Ambulatory Visit: Payer: Self-pay

## 2020-04-01 ENCOUNTER — Encounter: Payer: Self-pay | Admitting: *Deleted

## 2020-04-01 ENCOUNTER — Ambulatory Visit
Admission: RE | Admit: 2020-04-01 | Discharge: 2020-04-01 | Disposition: A | Discharge status: Home / Self Care | Payer: BC Managed Care – PPO | Source: Ambulatory Visit | Attending: Radiation Oncology | Admitting: Radiation Oncology

## 2020-04-01 DIAGNOSIS — C50312 Malignant neoplasm of lower-inner quadrant of left female breast: Secondary | ICD-10-CM

## 2020-04-01 DIAGNOSIS — Z510 Encounter for antineoplastic radiation therapy: Secondary | ICD-10-CM | POA: Diagnosis not present

## 2020-04-01 DIAGNOSIS — Z170 Estrogen receptor positive status [ER+]: Secondary | ICD-10-CM | POA: Diagnosis not present

## 2020-04-01 DIAGNOSIS — D0512 Intraductal carcinoma in situ of left breast: Secondary | ICD-10-CM | POA: Diagnosis not present

## 2020-04-02 ENCOUNTER — Ambulatory Visit
Admission: RE | Admit: 2020-04-02 | Discharge: 2020-04-02 | Disposition: A | Discharge status: Home / Self Care | Payer: BC Managed Care – PPO | Source: Ambulatory Visit | Attending: Radiation Oncology | Admitting: Radiation Oncology

## 2020-04-02 ENCOUNTER — Other Ambulatory Visit: Payer: Self-pay

## 2020-04-02 DIAGNOSIS — D0512 Intraductal carcinoma in situ of left breast: Secondary | ICD-10-CM | POA: Diagnosis not present

## 2020-04-02 DIAGNOSIS — C50312 Malignant neoplasm of lower-inner quadrant of left female breast: Secondary | ICD-10-CM | POA: Diagnosis not present

## 2020-04-02 DIAGNOSIS — Z510 Encounter for antineoplastic radiation therapy: Secondary | ICD-10-CM | POA: Diagnosis not present

## 2020-04-02 DIAGNOSIS — Z170 Estrogen receptor positive status [ER+]: Secondary | ICD-10-CM | POA: Diagnosis not present

## 2020-04-02 MED ORDER — ALRA NON-METALLIC DEODORANT (RAD-ONC)
1.0000 "application " | Freq: Once | TOPICAL | Status: AC
  Administered 2020-04-02: 1 via TOPICAL

## 2020-04-02 MED ORDER — RADIAPLEXRX EX GEL: Freq: Once | CUTANEOUS | Status: AC

## 2020-04-02 NOTE — Progress Notes (Addendum)
Pt here for patient teaching.  Pt given Radiation and You booklet, skin care instructions, Alra deodorant and Radiaplex gel.  Reviewed areas of pertinence such as fatigue, skin changes, breast tenderness and breast swelling . Pt able to give teach back of to pat skin, use unscented/gentle soap and use baby wipes,apply Radiaplex bid, avoid applying anything to skin within 4 hours of treatment, avoid wearing an under wire bra and to use an electric razor if they must shave. Pt verbalizes understanding of information given and will contact nursing with any questions or concerns.     [email or web address]/whattoexpect/index

## 2020-04-03 ENCOUNTER — Other Ambulatory Visit: Payer: Self-pay

## 2020-04-03 ENCOUNTER — Ambulatory Visit
Admission: RE | Admit: 2020-04-03 | Discharge: 2020-04-03 | Disposition: A | Discharge status: Home / Self Care | Payer: BC Managed Care – PPO | Source: Ambulatory Visit | Attending: Radiation Oncology | Admitting: Radiation Oncology

## 2020-04-03 DIAGNOSIS — D0512 Intraductal carcinoma in situ of left breast: Secondary | ICD-10-CM | POA: Diagnosis not present

## 2020-04-03 DIAGNOSIS — C50312 Malignant neoplasm of lower-inner quadrant of left female breast: Secondary | ICD-10-CM | POA: Diagnosis not present

## 2020-04-03 DIAGNOSIS — Z170 Estrogen receptor positive status [ER+]: Secondary | ICD-10-CM | POA: Diagnosis not present

## 2020-04-03 DIAGNOSIS — Z510 Encounter for antineoplastic radiation therapy: Secondary | ICD-10-CM | POA: Diagnosis not present

## 2020-04-04 ENCOUNTER — Other Ambulatory Visit: Payer: Self-pay

## 2020-04-04 ENCOUNTER — Ambulatory Visit
Admission: RE | Admit: 2020-04-04 | Discharge: 2020-04-04 | Disposition: A | Discharge status: Home / Self Care | Payer: BC Managed Care – PPO | Source: Ambulatory Visit | Attending: Radiation Oncology | Admitting: Radiation Oncology

## 2020-04-04 DIAGNOSIS — D0512 Intraductal carcinoma in situ of left breast: Secondary | ICD-10-CM | POA: Diagnosis not present

## 2020-04-04 DIAGNOSIS — Z170 Estrogen receptor positive status [ER+]: Secondary | ICD-10-CM | POA: Diagnosis not present

## 2020-04-04 DIAGNOSIS — C50312 Malignant neoplasm of lower-inner quadrant of left female breast: Secondary | ICD-10-CM | POA: Diagnosis not present

## 2020-04-04 DIAGNOSIS — Z510 Encounter for antineoplastic radiation therapy: Secondary | ICD-10-CM | POA: Diagnosis not present

## 2020-04-05 ENCOUNTER — Ambulatory Visit
Admission: RE | Admit: 2020-04-05 | Discharge: 2020-04-05 | Disposition: A | Discharge status: Home / Self Care | Payer: BC Managed Care – PPO | Source: Ambulatory Visit | Attending: Radiation Oncology | Admitting: Radiation Oncology

## 2020-04-05 ENCOUNTER — Other Ambulatory Visit: Payer: Self-pay

## 2020-04-05 DIAGNOSIS — D0512 Intraductal carcinoma in situ of left breast: Secondary | ICD-10-CM | POA: Diagnosis not present

## 2020-04-05 DIAGNOSIS — C50312 Malignant neoplasm of lower-inner quadrant of left female breast: Secondary | ICD-10-CM | POA: Diagnosis not present

## 2020-04-05 DIAGNOSIS — Z510 Encounter for antineoplastic radiation therapy: Secondary | ICD-10-CM | POA: Diagnosis not present

## 2020-04-05 DIAGNOSIS — Z170 Estrogen receptor positive status [ER+]: Secondary | ICD-10-CM | POA: Diagnosis not present

## 2020-04-08 ENCOUNTER — Ambulatory Visit
Admission: RE | Admit: 2020-04-08 | Discharge: 2020-04-08 | Disposition: A | Discharge status: Home / Self Care | Payer: BC Managed Care – PPO | Source: Ambulatory Visit | Attending: Radiation Oncology | Admitting: Radiation Oncology

## 2020-04-08 ENCOUNTER — Other Ambulatory Visit: Payer: Self-pay

## 2020-04-08 DIAGNOSIS — D0512 Intraductal carcinoma in situ of left breast: Secondary | ICD-10-CM | POA: Diagnosis not present

## 2020-04-08 DIAGNOSIS — Z510 Encounter for antineoplastic radiation therapy: Secondary | ICD-10-CM | POA: Diagnosis not present

## 2020-04-08 DIAGNOSIS — C50312 Malignant neoplasm of lower-inner quadrant of left female breast: Secondary | ICD-10-CM | POA: Diagnosis not present

## 2020-04-08 DIAGNOSIS — Z170 Estrogen receptor positive status [ER+]: Secondary | ICD-10-CM | POA: Diagnosis not present

## 2020-04-09 ENCOUNTER — Ambulatory Visit
Admission: RE | Admit: 2020-04-09 | Discharge: 2020-04-09 | Disposition: A | Discharge status: Home / Self Care | Payer: BC Managed Care – PPO | Source: Ambulatory Visit | Attending: Radiation Oncology | Admitting: Radiation Oncology

## 2020-04-09 DIAGNOSIS — D0512 Intraductal carcinoma in situ of left breast: Secondary | ICD-10-CM | POA: Diagnosis not present

## 2020-04-09 DIAGNOSIS — C50312 Malignant neoplasm of lower-inner quadrant of left female breast: Secondary | ICD-10-CM | POA: Diagnosis not present

## 2020-04-09 DIAGNOSIS — Z510 Encounter for antineoplastic radiation therapy: Secondary | ICD-10-CM | POA: Diagnosis not present

## 2020-04-09 DIAGNOSIS — Z170 Estrogen receptor positive status [ER+]: Secondary | ICD-10-CM | POA: Diagnosis not present

## 2020-04-10 ENCOUNTER — Ambulatory Visit
Admission: RE | Admit: 2020-04-10 | Discharge: 2020-04-10 | Disposition: A | Discharge status: Home / Self Care | Payer: BC Managed Care – PPO | Source: Ambulatory Visit | Attending: Radiation Oncology | Admitting: Radiation Oncology

## 2020-04-10 DIAGNOSIS — Z170 Estrogen receptor positive status [ER+]: Secondary | ICD-10-CM | POA: Diagnosis not present

## 2020-04-10 DIAGNOSIS — D0512 Intraductal carcinoma in situ of left breast: Secondary | ICD-10-CM | POA: Diagnosis not present

## 2020-04-10 DIAGNOSIS — Z510 Encounter for antineoplastic radiation therapy: Secondary | ICD-10-CM | POA: Diagnosis not present

## 2020-04-10 DIAGNOSIS — C50312 Malignant neoplasm of lower-inner quadrant of left female breast: Secondary | ICD-10-CM | POA: Diagnosis not present

## 2020-04-11 ENCOUNTER — Ambulatory Visit
Admission: RE | Admit: 2020-04-11 | Discharge: 2020-04-11 | Disposition: A | Discharge status: Home / Self Care | Payer: BC Managed Care – PPO | Source: Ambulatory Visit | Attending: Radiation Oncology | Admitting: Radiation Oncology

## 2020-04-11 DIAGNOSIS — Z510 Encounter for antineoplastic radiation therapy: Secondary | ICD-10-CM | POA: Diagnosis not present

## 2020-04-11 DIAGNOSIS — Z170 Estrogen receptor positive status [ER+]: Secondary | ICD-10-CM | POA: Diagnosis not present

## 2020-04-11 DIAGNOSIS — C50312 Malignant neoplasm of lower-inner quadrant of left female breast: Secondary | ICD-10-CM | POA: Diagnosis not present

## 2020-04-11 DIAGNOSIS — D0512 Intraductal carcinoma in situ of left breast: Secondary | ICD-10-CM | POA: Diagnosis not present

## 2020-04-12 ENCOUNTER — Ambulatory Visit
Admission: RE | Admit: 2020-04-12 | Discharge: 2020-04-12 | Disposition: A | Discharge status: Home / Self Care | Payer: BC Managed Care – PPO | Source: Ambulatory Visit | Attending: Radiation Oncology | Admitting: Radiation Oncology

## 2020-04-12 ENCOUNTER — Other Ambulatory Visit: Payer: Self-pay

## 2020-04-12 DIAGNOSIS — Z510 Encounter for antineoplastic radiation therapy: Secondary | ICD-10-CM | POA: Diagnosis not present

## 2020-04-12 DIAGNOSIS — C50312 Malignant neoplasm of lower-inner quadrant of left female breast: Secondary | ICD-10-CM | POA: Diagnosis not present

## 2020-04-12 DIAGNOSIS — D0512 Intraductal carcinoma in situ of left breast: Secondary | ICD-10-CM | POA: Diagnosis not present

## 2020-04-12 DIAGNOSIS — Z170 Estrogen receptor positive status [ER+]: Secondary | ICD-10-CM | POA: Diagnosis not present

## 2020-04-15 ENCOUNTER — Ambulatory Visit
Admission: RE | Admit: 2020-04-15 | Discharge: 2020-04-15 | Disposition: A | Discharge status: Home / Self Care | Payer: BC Managed Care – PPO | Source: Ambulatory Visit | Attending: Radiation Oncology | Admitting: Radiation Oncology

## 2020-04-15 DIAGNOSIS — C50312 Malignant neoplasm of lower-inner quadrant of left female breast: Secondary | ICD-10-CM | POA: Diagnosis not present

## 2020-04-15 DIAGNOSIS — D0512 Intraductal carcinoma in situ of left breast: Secondary | ICD-10-CM | POA: Diagnosis not present

## 2020-04-15 DIAGNOSIS — Z170 Estrogen receptor positive status [ER+]: Secondary | ICD-10-CM | POA: Diagnosis not present

## 2020-04-15 DIAGNOSIS — Z510 Encounter for antineoplastic radiation therapy: Secondary | ICD-10-CM | POA: Diagnosis not present

## 2020-04-16 ENCOUNTER — Ambulatory Visit
Admission: RE | Admit: 2020-04-16 | Discharge: 2020-04-16 | Disposition: A | Discharge status: Home / Self Care | Payer: BC Managed Care – PPO | Source: Ambulatory Visit | Attending: Radiation Oncology | Admitting: Radiation Oncology

## 2020-04-16 ENCOUNTER — Ambulatory Visit: Payer: BC Managed Care – PPO | Admitting: Radiation Oncology

## 2020-04-16 DIAGNOSIS — Z510 Encounter for antineoplastic radiation therapy: Secondary | ICD-10-CM | POA: Diagnosis not present

## 2020-04-16 DIAGNOSIS — C50312 Malignant neoplasm of lower-inner quadrant of left female breast: Secondary | ICD-10-CM | POA: Diagnosis not present

## 2020-04-16 DIAGNOSIS — D0512 Intraductal carcinoma in situ of left breast: Secondary | ICD-10-CM | POA: Diagnosis not present

## 2020-04-16 DIAGNOSIS — Z170 Estrogen receptor positive status [ER+]: Secondary | ICD-10-CM | POA: Diagnosis not present

## 2020-04-17 ENCOUNTER — Ambulatory Visit
Admission: RE | Admit: 2020-04-17 | Discharge: 2020-04-17 | Disposition: A | Discharge status: Home / Self Care | Payer: BC Managed Care – PPO | Source: Ambulatory Visit | Attending: Radiation Oncology | Admitting: Radiation Oncology

## 2020-04-17 DIAGNOSIS — Z170 Estrogen receptor positive status [ER+]: Secondary | ICD-10-CM | POA: Diagnosis not present

## 2020-04-17 DIAGNOSIS — D0512 Intraductal carcinoma in situ of left breast: Secondary | ICD-10-CM | POA: Diagnosis not present

## 2020-04-17 DIAGNOSIS — Z510 Encounter for antineoplastic radiation therapy: Secondary | ICD-10-CM | POA: Diagnosis not present

## 2020-04-17 DIAGNOSIS — C50312 Malignant neoplasm of lower-inner quadrant of left female breast: Secondary | ICD-10-CM | POA: Diagnosis not present

## 2020-04-18 ENCOUNTER — Ambulatory Visit
Admission: RE | Admit: 2020-04-18 | Discharge: 2020-04-18 | Disposition: A | Discharge status: Home / Self Care | Payer: BC Managed Care – PPO | Source: Ambulatory Visit | Attending: Radiation Oncology | Admitting: Radiation Oncology

## 2020-04-18 DIAGNOSIS — Z170 Estrogen receptor positive status [ER+]: Secondary | ICD-10-CM | POA: Diagnosis not present

## 2020-04-18 DIAGNOSIS — Z510 Encounter for antineoplastic radiation therapy: Secondary | ICD-10-CM | POA: Diagnosis not present

## 2020-04-18 DIAGNOSIS — C50312 Malignant neoplasm of lower-inner quadrant of left female breast: Secondary | ICD-10-CM | POA: Diagnosis not present

## 2020-04-18 DIAGNOSIS — D0512 Intraductal carcinoma in situ of left breast: Secondary | ICD-10-CM | POA: Diagnosis not present

## 2020-04-19 ENCOUNTER — Ambulatory Visit
Admission: RE | Admit: 2020-04-19 | Discharge: 2020-04-19 | Disposition: A | Discharge status: Home / Self Care | Payer: BC Managed Care – PPO | Source: Ambulatory Visit | Attending: Radiation Oncology | Admitting: Radiation Oncology

## 2020-04-19 DIAGNOSIS — D0512 Intraductal carcinoma in situ of left breast: Secondary | ICD-10-CM | POA: Diagnosis not present

## 2020-04-19 DIAGNOSIS — C50312 Malignant neoplasm of lower-inner quadrant of left female breast: Secondary | ICD-10-CM | POA: Diagnosis not present

## 2020-04-19 DIAGNOSIS — Z170 Estrogen receptor positive status [ER+]: Secondary | ICD-10-CM | POA: Diagnosis not present

## 2020-04-19 DIAGNOSIS — Z510 Encounter for antineoplastic radiation therapy: Secondary | ICD-10-CM | POA: Diagnosis not present

## 2020-04-22 ENCOUNTER — Ambulatory Visit
Admission: RE | Admit: 2020-04-22 | Discharge: 2020-04-22 | Disposition: A | Discharge status: Home / Self Care | Payer: BC Managed Care – PPO | Source: Ambulatory Visit | Attending: Radiation Oncology | Admitting: Radiation Oncology

## 2020-04-22 DIAGNOSIS — D0512 Intraductal carcinoma in situ of left breast: Secondary | ICD-10-CM | POA: Diagnosis not present

## 2020-04-22 DIAGNOSIS — Z510 Encounter for antineoplastic radiation therapy: Secondary | ICD-10-CM | POA: Diagnosis not present

## 2020-04-22 DIAGNOSIS — C50312 Malignant neoplasm of lower-inner quadrant of left female breast: Secondary | ICD-10-CM | POA: Diagnosis not present

## 2020-04-22 DIAGNOSIS — Z170 Estrogen receptor positive status [ER+]: Secondary | ICD-10-CM | POA: Diagnosis not present

## 2020-04-23 ENCOUNTER — Ambulatory Visit
Admission: RE | Admit: 2020-04-23 | Discharge: 2020-04-23 | Disposition: A | Discharge status: Home / Self Care | Payer: BC Managed Care – PPO | Source: Ambulatory Visit | Attending: Radiation Oncology | Admitting: Radiation Oncology

## 2020-04-23 DIAGNOSIS — C50312 Malignant neoplasm of lower-inner quadrant of left female breast: Secondary | ICD-10-CM | POA: Diagnosis not present

## 2020-04-23 DIAGNOSIS — Z170 Estrogen receptor positive status [ER+]: Secondary | ICD-10-CM | POA: Diagnosis not present

## 2020-04-23 DIAGNOSIS — Z510 Encounter for antineoplastic radiation therapy: Secondary | ICD-10-CM | POA: Diagnosis not present

## 2020-04-23 DIAGNOSIS — D0512 Intraductal carcinoma in situ of left breast: Secondary | ICD-10-CM | POA: Diagnosis not present

## 2020-04-24 ENCOUNTER — Ambulatory Visit
Admission: RE | Admit: 2020-04-24 | Discharge: 2020-04-24 | Disposition: A | Discharge status: Home / Self Care | Payer: BC Managed Care – PPO | Source: Ambulatory Visit | Attending: Radiation Oncology | Admitting: Radiation Oncology

## 2020-04-24 DIAGNOSIS — Z510 Encounter for antineoplastic radiation therapy: Secondary | ICD-10-CM | POA: Diagnosis not present

## 2020-04-24 DIAGNOSIS — C50312 Malignant neoplasm of lower-inner quadrant of left female breast: Secondary | ICD-10-CM | POA: Diagnosis not present

## 2020-04-24 DIAGNOSIS — Z170 Estrogen receptor positive status [ER+]: Secondary | ICD-10-CM | POA: Diagnosis not present

## 2020-04-24 DIAGNOSIS — D0512 Intraductal carcinoma in situ of left breast: Secondary | ICD-10-CM | POA: Diagnosis not present

## 2020-04-25 ENCOUNTER — Ambulatory Visit
Admission: RE | Admit: 2020-04-25 | Discharge: 2020-04-25 | Disposition: A | Discharge status: Home / Self Care | Payer: BC Managed Care – PPO | Source: Ambulatory Visit | Attending: Radiation Oncology | Admitting: Radiation Oncology

## 2020-04-25 ENCOUNTER — Encounter: Payer: Self-pay | Admitting: *Deleted

## 2020-04-25 DIAGNOSIS — C50312 Malignant neoplasm of lower-inner quadrant of left female breast: Secondary | ICD-10-CM | POA: Diagnosis not present

## 2020-04-25 DIAGNOSIS — Z170 Estrogen receptor positive status [ER+]: Secondary | ICD-10-CM | POA: Diagnosis not present

## 2020-04-25 DIAGNOSIS — D0512 Intraductal carcinoma in situ of left breast: Secondary | ICD-10-CM | POA: Diagnosis not present

## 2020-04-25 DIAGNOSIS — Z510 Encounter for antineoplastic radiation therapy: Secondary | ICD-10-CM | POA: Diagnosis not present

## 2020-04-26 ENCOUNTER — Ambulatory Visit
Admission: RE | Admit: 2020-04-26 | Discharge: 2020-04-26 | Disposition: A | Discharge status: Home / Self Care | Payer: BC Managed Care – PPO | Source: Ambulatory Visit | Attending: Radiation Oncology | Admitting: Radiation Oncology

## 2020-04-26 ENCOUNTER — Encounter: Payer: Self-pay | Admitting: Radiation Oncology

## 2020-04-26 DIAGNOSIS — Z170 Estrogen receptor positive status [ER+]: Secondary | ICD-10-CM | POA: Diagnosis not present

## 2020-04-26 DIAGNOSIS — D0512 Intraductal carcinoma in situ of left breast: Secondary | ICD-10-CM | POA: Diagnosis not present

## 2020-04-26 DIAGNOSIS — C50312 Malignant neoplasm of lower-inner quadrant of left female breast: Secondary | ICD-10-CM | POA: Diagnosis not present

## 2020-04-26 DIAGNOSIS — Z510 Encounter for antineoplastic radiation therapy: Secondary | ICD-10-CM | POA: Diagnosis not present

## 2020-05-27 ENCOUNTER — Other Ambulatory Visit: Payer: Self-pay

## 2020-05-27 ENCOUNTER — Encounter: Payer: Self-pay | Admitting: Radiation Oncology

## 2020-05-27 ENCOUNTER — Ambulatory Visit
Admission: RE | Admit: 2020-05-27 | Discharge: 2020-05-27 | Disposition: A | Discharge status: Home / Self Care | Payer: BC Managed Care – PPO | Source: Ambulatory Visit | Attending: Radiation Oncology | Admitting: Radiation Oncology

## 2020-05-27 VITALS — BP 112/61 | HR 71 | Temp 97.8°F | Resp 16 | Ht 59.0 in | Wt 108.6 lb

## 2020-05-27 DIAGNOSIS — Z170 Estrogen receptor positive status [ER+]: Secondary | ICD-10-CM | POA: Diagnosis not present

## 2020-05-27 DIAGNOSIS — Z79899 Other long term (current) drug therapy: Secondary | ICD-10-CM | POA: Insufficient documentation

## 2020-05-27 DIAGNOSIS — Z923 Personal history of irradiation: Secondary | ICD-10-CM | POA: Diagnosis not present

## 2020-05-27 DIAGNOSIS — C50312 Malignant neoplasm of lower-inner quadrant of left female breast: Secondary | ICD-10-CM | POA: Diagnosis not present

## 2020-05-27 NOTE — Progress Notes (Incomplete)
  Patient Name: Karen Pruitt MRN: [id number] DOB: 08/27/1970 Referring Physician: Lurline Del (Profile Not Attached) Date of Service: 04/26/2020 [facility], [place]                                                        End Of Treatment Note  Diagnoses: C50.312-Malignant neoplasm of lower-inner quadrant of left female breast  Cancer Staging: Stage 0, Left Breast LIQ, DCIS, ER+ / PR+, Grade 1  Intent: Curative  Radiation Treatment Dates: 04/01/2020 through 04/26/2020 Site Technique Total Dose (Gy) Dose per Fx (Gy) Completed Fx Beam Energies  Breast, Left: Breast_Lt 3D 40.05/40.05 2.67 15/15 6X  Breast, Left: Breast_Lt_Bst 3D 10/10 2 5/5 6X   Narrative: The patient tolerated radiation therapy relatively well. She reported mild fatigue and a rash to the left breast. During the second week of treatment, she began to develop some slight erythema to the left breast area. As treatment continued, it developed into mild radiation dermatitis and then diffuse erythema without any skin breakdown. Towards the end of treatment, there was noted to be some moderate swelling to the left nipple area.  Plan: The patient will follow-up with radiation oncology in one month.  ________________________________________________   Blair Promise, PhD, MD  This document serves as a record of services personally performed by Gery Pray, MD. It was created on his behalf by Clerance Lav, a trained medical scribe. The creation of this record is based on the scribe's personal observations and the provider's statements to them. This document has been checked and approved by the attending provider.

## 2020-05-27 NOTE — Progress Notes (Addendum)
Patient here for a one month f/u visit. Denies pain in her left breast and rash has gone. She reports nipple sensitivity. Patient denies any problems today.  BP 112/61 (BP Location: Right Arm, Patient Position: Sitting, Cuff Size: Normal)   Pulse 71   Temp 97.8 F (36.6 C)   Resp 16   Ht 4\' 11"  (1.499 m)   Wt 108 lb 9.6 oz (49.3 kg)   SpO2 100%   BMI 21.93 kg/m   Wt Readings from Last 3 Encounters:  05/27/20 108 lb 9.6 oz (49.3 kg)  03/13/20 106 lb (48.1 kg)  02/19/20 108 lb (49 kg)

## 2020-05-27 NOTE — Progress Notes (Signed)
°  Radiation Oncology         [phone] ________________________________  Name: Karen Pruitt MRN: [id number]  Date: 05/27/2020  DOB: [birth year]  Follow-Up Visit Note  CC: Aloha Gell, MD  Magrinat, Virgie Dad, MD    ICD-10-CM   1. Malignant neoplasm of lower-inner quadrant of left breast in female, estrogen receptor positive ([place])  C50.312    Z17.0     Diagnosis: Stage0, LeftBreast LIQ,DCIS, ER+/ PR+, Grade1  Interval Since Last Radiation: One month  Radiation Treatment Dates: 04/01/2020 through 04/26/2020 Site Technique Total Dose (Gy) Dose per Fx (Gy) Completed Fx Beam Energies  Breast, Left: Breast_Lt 3D 40.05/40.05 2.67 15/15 6X  Breast, Left: Breast_Lt_Bst 3D 10/10 2 5/5 6X    Narrative:  The patient returns today for routine follow-up. No signiicant interval history since the end of treatment.  On review of systems, she reports a soreness in the breast area.  Her nipple area is continues to be sensitive.  She denies any nipple discharge or bleeding.. She denies residual fatigue.                     ALLERGIES:  has No Known Allergies.  Meds: Current Outpatient Medications  Medication Sig Dispense Refill   cetirizine (ZYRTEC) 10 MG chewable tablet Chew 10 mg by mouth daily.     tranexamic acid (LYSTEDA) 650 MG TABS tablet Take 2 tablets (1,300 mg total) by mouth 3 (three) times daily. 30 tablet    No current facility-administered medications for this encounter.    Physical Findings: The patient is in no acute distress. Patient is alert and oriented.  height is 4\' 11"  (1.499 m) and weight is 108 lb 9.6 oz (49.3 kg). Her temperature is 97.8 F (36.6 C). Her blood pressure is 112/61 and her pulse is 71. Her respiration is 16 and oxygen saturation is 100%.  No significant changes. Lungs are clear to auscultation bilaterally. Heart has regular rate and rhythm. No palpable cervical, supraclavicular, or axillary adenopathy. Abdomen soft, non-tender, normal  bowel sounds. Right breast: No palpable mass, nipple discharge, or bleeding.  Left breast: Skin is healed very well.  Mild edema noted in the central breast.  No nipple discharge or bleeding.  No dominant masses palpable.  Lab Findings: Lab Results  Component Value Date   WBC 8.1 02/19/2020   HGB 12.1 02/19/2020   HCT 36.2 02/19/2020   MCV 88.9 02/19/2020   PLT 236 02/19/2020    Radiographic Findings: No results found.  Impression: Stage0, LeftBreast LIQ,DCIS, ER+/ PR+, Grade1  The patient is recovering from the effects of radiation.  No evidence of recurrence on clinical exam today.  Patient skin is healed very well.  Plan: The patient is scheduled to follow up with Dr. Jana Hakim on 09/23/2020. She will follow up with radiation oncology on an as needed basis in light of her close follow-up with medical oncology and surgery.  She will follow-up with surgery and January of next year.    ____________________________________   Blair Promise, PhD, MD  This document serves as a record of services personally performed by Gery Pray, MD. It was created on his behalf by Clerance Lav, a trained medical scribe. The creation of this record is based on the scribe's personal observations and the provider's statements to them. This document has been checked and approved by the attending provider.

## 2020-09-22 NOTE — Progress Notes (Signed)
[facility]  [phone]) [phone] [phone]     ID: GRACYE Pruitt DOB: 08/14/1970  MR#: [id number]  JYN#:[id number]  Patient Care Team: Noland Fordyce, MD as PCP - General (Obstetrics and Gynecology) Manus Rudd, MD as Consulting Physician (General Surgery) Yolani Vo, Valentino Hue, MD as Consulting Physician (Oncology) Antony Blackbird, MD as Consulting Physician (Radiation Oncology) [place], Amy, MD as Consulting Physician (Dermatology) Pershing Proud, RN as Oncology Nurse Navigator Donnelly Angelica, RN as Oncology Nurse Navigator Lowella Dell, MD OTHER MD:  CHIEF COMPLAINT: Noninvasive estrogen receptor positive breast cancer; high risk patient  CURRENT TREATMENT: Considering adjuvant radiation, tamoxifen, intensified screening   INTERVAL HISTORY: Karen Pruitt returns today for follow up of her noninvasive breast cancer. She was evaluated in the breast cancer clinic on 02/19/2020.  Since her last visit, she underwent genetic counseling on 03/06/2020. Results were negative with the exception of two variants of uncertain significance, one in CDC73 and the other in MSH6.  She was referred to Dr. Roselind Messier on 03/13/2020 to discuss adjuvant radiation therapy. She agreed to proceed and subsequently received treatment from 04/01/2020 through 04/26/2020.  She is scheduled for her first routine mammography since diagnosis on 10/30/2020.   REVIEW OF SYSTEMS: Lynita has an excellent exercise program with yoga, bar, weights, and walking her dog 3 times a day.  She has not had a period since June 2021.  She is having some hot flashes and mild vaginal dryness issues.  A detailed review of systems today was otherwise benign.   COVID 19 VACCINATION STATUS: Refuses vaccination   HISTORY OF CURRENT ILLNESS: From the original intake note:  Karen Pruitt had routine screening mammography on 10/30/2019 showing a possible abnormality in the left breast. She underwent left  diagnostic mammography with tomography and left breast ultrasonography at [facility] on 11/10/2019 showing: breast density category D; 1.0 cm group of calcifications in lower-inner left breast; negative for left axillary lymphadenopathy. At that time, she also noted a recent episode of dark colored nipple discharge.  Accordingly on 11/27/2019 she proceeded to biopsy of the left breast area in question. The pathology from this procedure ([id number]) showed: atypical ductal hyperplasia and flat epithelial atypia with calcifications; background breast parenchyma with fibrocystic change and pseudoangiomatous stromal hyperplasia.   She underwent breast MRI on 01/25/2020 showing: breast composition D; no evidence of breast malignancy; small bilateral breast cysts.  She proceeded to left lumpectomy on 02/07/2020 under Dr. Corliss Skains. Pathology from the procedure [id number]) showed: low-grade ductal carcinoma in situ with calcifications; margins not involved; fibrocystic changes with sclerosing adenosis and calcifications. Prognostic indicators significant for: estrogen receptor, 95% positive and progesterone receptor, 10% positive, both with moderate staining intensity.   The patient's subsequent history is as detailed below.   PAST MEDICAL HISTORY: Past Medical History:  Diagnosis Date  . Anxiety   . Anxiety and depression 06/20/2012  . Depression   . Family history of breast cancer   . Family history of melanoma   . Family history of pancreatic cancer   . Interstitial cystitis   . Maternal anemia complicating pregnancy, childbirth, or the puerperium 06/20/2012   Mild IDA w/ superimposed ABL anemia  . Pelvic floor dysfunction   . Postpartum care following cesarean delivery 06/20/2012  . S/P cesarean section - elective primary 12/8 06/20/2012    PAST SURGICAL HISTORY: Past Surgical History:  Procedure Laterality Date  . BREAST LUMPECTOMY WITH RADIOACTIVE SEED LOCALIZATION Left 02/07/2020    Procedure: LEFT BREAST LUMPECTOMY  WITH RADIOACTIVE SEED LOCALIZATION;  Surgeon: Manus Rudd, MD;  Location: [facility];  Service: General;  Laterality: Left;  . CESAREAN SECTION  06/19/2012   Procedure: CESAREAN SECTION;  Surgeon: Tresa Endo A. Ernestina Penna, MD;  Location: [facility];  Service: Obstetrics;  Laterality: N/A;  Primary Cesarean Section   . CESAREAN SECTION WITH BILATERAL TUBAL LIGATION N/A 09/05/2013   Procedure: CESAREAN SECTION WITH BILATERAL TUBAL LIGATION;  Surgeon: Lenoard Aden, MD;  Location: [facility];  Service: Obstetrics;  Laterality: N/A;  . CYSTOSCOPY WITH HYDRODISTENSION AND BIOPSY    . TOE SURGERY      FAMILY HISTORY: Family History  Problem Relation Age of Onset  . Arthritis Mother   . Bleeding Disorder Mother   . Depression Mother   . Hypertension Mother   . Alcohol abuse Father   . Cerebrovascular Accident Father   . Depression Father   . Heart disease Father   . Pancreatic cancer Paternal Aunt   . Melanoma Maternal Grandmother        in the eye  . Liver disease Maternal Grandfather   . Breast cancer Paternal Grandmother        dbl mastectomy, d in late 51s-60s  . Dementia Paternal Grandfather   . Other Paternal Aunt        COVID  . Dementia Paternal Aunt   The patient's parents are both 33 years old as of August 2021. Her paternal grandmother was BRCA+ and had metastatic breast cancer. There is a paternal aunt with cancer, possibly pancreatic. She reports melanoma in her maternal grandmother.  The patient has 1 sister, no brothers   GYNECOLOGIC HISTORY:  No LMP recorded. Menarche: 50 years old Age at first live birth: 50 years old GX P 2 LMP irregular, last menstrual period was June 2021. Contraceptive: used for approximately 12 years, s/p tubal ligation HRT n/a  Hysterectomy? no BSO? 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  Her husband Karen Pruitt is Interior and spatial designer of IT at [employer].  Their daughters are 22 and 23 years old as of August 2021.  The patient attends [employer].    ADVANCED DIRECTIVES: In the absence of any documentation to the contrary, the patient's spouse is their HCPOA.    HEALTH MAINTENANCE: Social History   Tobacco Use  . Smoking status: Never Smoker  . Smokeless tobacco: Never Used  Vaping Use  . Vaping Use: Never used  Substance Use Topics  . Alcohol use: Yes    Comment: occas  . Drug use: No     Colonoscopy: n/a (age)  PAP: April 2021  Bone density: n/a (age)   No Known Allergies  Current Outpatient Medications  Medication Sig Dispense Refill  . cetirizine (ZYRTEC) 10 MG chewable tablet Chew 10 mg by mouth daily.    . tranexamic acid (LYSTEDA) 650 MG TABS tablet Take 2 tablets (1,300 mg total) by mouth 3 (three) times daily. 30 tablet    No current facility-administered medications for this visit.    OBJECTIVE: White woman who appears younger than stated age  Vitals:   09/23/20 0938  BP: 116/62  Pulse: 63  Resp: 17  Temp: 97.7 F (36.5 C)  SpO2: 100%     Body mass index is 21.31 kg/m.   Wt Readings from Last 3 Encounters:  09/23/20 105 lb 8 oz (47.9 kg)  05/27/20 108 lb 9.6 oz (49.3 kg)  03/13/20  106 lb (48.1 kg)      ECOG FS:1 - Symptomatic but completely ambulatory  Sclerae unicteric, EOMs intact Wearing a mask No cervical or supraclavicular adenopathy Lungs no rales or rhonchi Heart regular rate and rhythm Abd soft, nontender, positive bowel sounds MSK no focal spinal tenderness, no upper extremity lymphedema Neuro: nonfocal, well oriented, appropriate affect Breasts: The right breast is benign.  The left breast is status post lumpectomy.  The cosmetic result is excellent.  There is no evidence of residual or recurrent disease.  Both axillae are benign.   LAB RESULTS:  CMP     Component Value Date/Time   NA 141 02/19/2020 1607   K 3.8 02/19/2020 1607   CL 105 02/19/2020 1607   CO2  28 02/19/2020 1607   GLUCOSE 96 02/19/2020 1607   BUN 8 02/19/2020 1607   CREATININE 0.85 02/19/2020 1607   CALCIUM 10.2 02/19/2020 1607   PROT 6.9 02/19/2020 1607   ALBUMIN 4.1 02/19/2020 1607   AST 19 02/19/2020 1607   ALT 18 02/19/2020 1607   ALKPHOS 54 02/19/2020 1607   BILITOT 0.9 02/19/2020 1607   GFRNONAA >60 02/19/2020 1607   GFRAA >60 02/19/2020 1607    No results found for: TOTALPROTELP, ALBUMINELP, A1GS, A2GS, BETS, BETA2SER, GAMS, MSPIKE, SPEI  Lab Results  Component Value Date   WBC 8.1 02/19/2020   NEUTROABS 4.1 02/19/2020   HGB 12.1 02/19/2020   HCT 36.2 02/19/2020   MCV 88.9 02/19/2020   PLT 236 02/19/2020    No results found for: LABCA2  No components found for: [id number]  No results for input(s): INR in the last 168 hours.  No results found for: LABCA2  No results found for: NWG956  No results found for: OZH086  No results found for: VHQ469  No results found for: CA2729  No components found for: HGQUANT  No results found for: CEA1 / No results found for: CEA1   No results found for: AFPTUMOR  No results found for: CHROMOGRNA  No results found for: KPAFRELGTCHN, LAMBDASER, KAPLAMBRATIO (kappa/lambda light chains)  No results found for: HGBA, HGBA2QUANT, HGBFQUANT, HGBSQUAN (Hemoglobinopathy evaluation)   No results found for: LDH  No results found for: IRON, TIBC, IRONPCTSAT (Iron and TIBC)  No results found for: FERRITIN  Urinalysis No results found for: COLORURINE, APPEARANCEUR, LABSPEC, PHURINE, GLUCOSEU, HGBUR, BILIRUBINUR, KETONESUR, PROTEINUR, UROBILINOGEN, NITRITE, LEUKOCYTESUR   STUDIES: No results found.  The Tyrer-Cisick score below is based only in the diagnosis of ADH; risk is greater with DCIS   ELIGIBLE FOR AVAILABLE RESEARCH PROTOCOL: AET  ASSESSMENT: 50 y.o. [place] woman status post left breast lower inner quadrant biopsy 11/11/2019 showing atypical ductal hyperplasia  (a) breast MRI 01/25/2020 shows no  mass or abnormal enhancement.  No adenopathy  (1) status post left lumpectomy 01/25/2020 showing ductal carcinoma in situ, grade 1, measuring 1.0 cm, with negative margins, estrogen and progesterone receptor positive  (2) adjuvant radiation: 04/01/2020 through 04/26/2020 Site Technique Total Dose (Gy) Dose per Fx (Gy) Completed Fx Beam Energies  Breast, Left: Breast_Lt 3D 40.05/40.05 2.67 15/15 6X  Breast, Left: Breast_Lt_Bst 3D 10/10 2 5/5 6X   (3) opted against antiestrogens (tamoxifen).  (4) genetics testing 03/13/2020 through the Multi-Gene Panel offered by [facility] found no deleterious mutations in AIP, ALK, APC, ATM, AXIN2,BAP1,  BARD1, BLM, BMPR1A, BRCA1, BRCA2, BRIP1, CASR, CDC73, CDH1, CDK4, CDKN1B, CDKN1C, CDKN2A (p14ARF), CDKN2A (p16INK4a), CEBPA, CHEK2, CTNNA1, DICER1, DIS3L2, EGFR (c.2369C>T, p.Thr790Met variant only), EPCAM (Deletion/duplication testing only), FH, FLCN, GATA2,  GPC3, GREM1 (Promoter region deletion/duplication testing only), HOXB13 (c.251G>A, p.Gly84Glu), HRAS, KIT, MAX, MEN1, MET, MITF (c.952G>A, p.Glu318Lys variant only), MLH1, MSH2, MSH3, MSH6, MUTYH, NBN, NF1, NF2, NTHL1, PALB2, PDGFRA, PHOX2B, PMS2, POLD1, POLE, POT1, PRKAR1A, PTCH1, PTEN, RAD50, RAD51C, RAD51D, RB1, RECQL4, RET, RNF43, RUNX1, SDHAF2, SDHA (sequence changes only), SDHB, SDHC, SDHD, SMAD4, SMARCA4, SMARCB1, SMARCE1, STK11, SUFU, TERC, TERT, TMEM127, TP53, TSC1, TSC2, VHL, WRN and WT1.    (a) CDC73 c.1333G>A and MSH6 c.1995B>C VUS identified on the multicancer panel.   (5) breast cancer high risk: Intensified screening  (a) mammography April yearly  (b) breast MRI October yearly   PLAN: Mechelle has recovered nicely from her surgery and radiation.  Interestingly she has not had a menstrual period since June 2021 and she is having menopausal symptoms.  We discussed testing with [facility] and estradiol but really the best time to do that would be after June of this year namely a year after her last menstrual  period.  She will discuss that with her gynecologist Dr. Tildon Husky.  We reviewed her breast cancer high risk and she understands that she has at least a 1 %/year chance of developing another breast cancer in either breast.  We discussed tamoxifen for risk reduction and she really is not interested in pursuing that option.  She is very interested however in intensified screening.  She had a breast MRI last year.  I am going to set her up for breast MRI October of this year and we discussed the difference between fast MRI and the standard MRI.  She understands we do not have head-to-head comparison between these 2 screening modalities but our expectation is that the fast MRI will prove because of cost and convenience as well as greatly increased sensitivity compared to mammography (especially in the breasts like hers) the eventual new screening standard  Will have a virtual visit in November 2022 to review those results and future testing as needed  Total encounter time 25 minutes.Raymond Gurney C. Brynlea Spindler, MD 09/23/2020 9:40 AM Medical Oncology and Hematology [facility] [address] [place], [place] [address] Tel. [phone]    Fax. [phone]   This document serves as a record of services personally performed by Ruthann Cancer, MD. It was created on his behalf by Mickie Bail, a trained medical scribe. The creation of this record is based on the scribe's personal observations and the provider's statements to them.   I, Ruthann Cancer MD, have reviewed the above documentation for accuracy and completeness, and I agree with the above.   *Total Encounter Time as defined by the Centers for Medicare and Medicaid Services includes, in addition to the face-to-face time of a patient visit (documented in the note above) non-face-to-face time: obtaining and reviewing outside history, ordering and reviewing medications, tests or procedures, care coordination (communications with other  health care professionals or caregivers) and documentation in the medical record.

## 2020-09-23 ENCOUNTER — Inpatient Hospital Stay: Payer: BC Managed Care – PPO | Attending: Oncology | Admitting: Oncology

## 2020-09-23 ENCOUNTER — Other Ambulatory Visit: Payer: Self-pay

## 2020-09-23 VITALS — BP 116/62 | HR 63 | Temp 97.7°F | Resp 17 | Ht 59.0 in | Wt 105.5 lb

## 2020-09-23 DIAGNOSIS — R232 Flushing: Secondary | ICD-10-CM | POA: Diagnosis not present

## 2020-09-23 DIAGNOSIS — R9230 Dense breasts, unspecified: Secondary | ICD-10-CM

## 2020-09-23 DIAGNOSIS — R922 Inconclusive mammogram: Secondary | ICD-10-CM

## 2020-09-23 DIAGNOSIS — Z923 Personal history of irradiation: Secondary | ICD-10-CM | POA: Insufficient documentation

## 2020-09-23 DIAGNOSIS — C50312 Malignant neoplasm of lower-inner quadrant of left female breast: Secondary | ICD-10-CM | POA: Insufficient documentation

## 2020-09-23 DIAGNOSIS — Z170 Estrogen receptor positive status [ER+]: Secondary | ICD-10-CM | POA: Diagnosis not present

## 2020-09-23 DIAGNOSIS — Z800 Family history of malignant neoplasm of digestive organs: Secondary | ICD-10-CM | POA: Diagnosis not present

## 2020-09-23 DIAGNOSIS — Z79811 Long term (current) use of aromatase inhibitors: Secondary | ICD-10-CM | POA: Diagnosis not present

## 2020-09-23 DIAGNOSIS — Z808 Family history of malignant neoplasm of other organs or systems: Secondary | ICD-10-CM | POA: Insufficient documentation

## 2020-09-23 DIAGNOSIS — Z9189 Other specified personal risk factors, not elsewhere classified: Secondary | ICD-10-CM

## 2020-09-24 ENCOUNTER — Telehealth: Payer: Self-pay | Admitting: Oncology

## 2020-09-24 NOTE — Telephone Encounter (Signed)
Scheduled appt per 3/14 los. Spoke to patient who is aware of appointment date and time and that appt is virtual.

## 2020-09-25 ENCOUNTER — Other Ambulatory Visit: Payer: Self-pay

## 2020-09-25 DIAGNOSIS — Z170 Estrogen receptor positive status [ER+]: Secondary | ICD-10-CM

## 2020-09-25 DIAGNOSIS — C50312 Malignant neoplasm of lower-inner quadrant of left female breast: Secondary | ICD-10-CM

## 2020-09-26 ENCOUNTER — Inpatient Hospital Stay: Admission: RE | Admit: 2020-09-26 | Payer: BC Managed Care – PPO | Source: Ambulatory Visit

## 2020-10-30 ENCOUNTER — Other Ambulatory Visit: Payer: Self-pay

## 2020-10-30 ENCOUNTER — Ambulatory Visit
Admission: RE | Admit: 2020-10-30 | Discharge: 2020-10-30 | Disposition: A | Discharge status: Home / Self Care | Payer: BC Managed Care – PPO | Source: Ambulatory Visit | Attending: Oncology | Admitting: Oncology

## 2020-10-30 DIAGNOSIS — Z170 Estrogen receptor positive status [ER+]: Secondary | ICD-10-CM

## 2020-10-30 DIAGNOSIS — N6092 Unspecified benign mammary dysplasia of left breast: Secondary | ICD-10-CM

## 2020-10-30 DIAGNOSIS — C50312 Malignant neoplasm of lower-inner quadrant of left female breast: Secondary | ICD-10-CM

## 2020-10-30 DIAGNOSIS — Z9189 Other specified personal risk factors, not elsewhere classified: Secondary | ICD-10-CM

## 2020-10-30 DIAGNOSIS — R9230 Dense breasts, unspecified: Secondary | ICD-10-CM

## 2020-10-30 DIAGNOSIS — R922 Inconclusive mammogram: Secondary | ICD-10-CM

## 2021-02-12 ENCOUNTER — Encounter (HOSPITAL_COMMUNITY): Payer: Self-pay

## 2021-03-15 IMAGING — US US BREAST*L* LIMITED INC AXILLA
1 series · 7 of 7 positions shown · non-contrast
Comparison: October 30, 2019

CLINICAL DATA: 48-year-old patient recalled from recent screening
mammogram for evaluation of left breast calcifications. In
discussion with the patient today, she describes seeing a drop of
brown/reddish fluid at the end of her left nipple recently.

EXAM:
DIGITAL DIAGNOSTIC LEFT MAMMOGRAM WITH CAD AND TOMO
ULTRASOUND LEFT BREAST

[Series 1: us breast*left* limited inc axilla · 0.05mm/px · 7 of 7 slices shown]
[im 1/7]
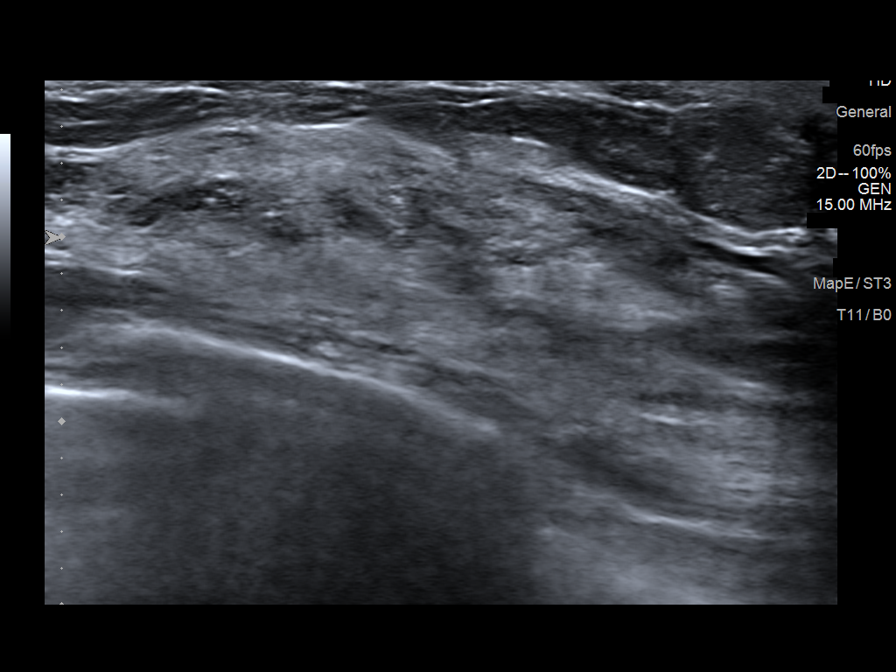
[im 2/7]
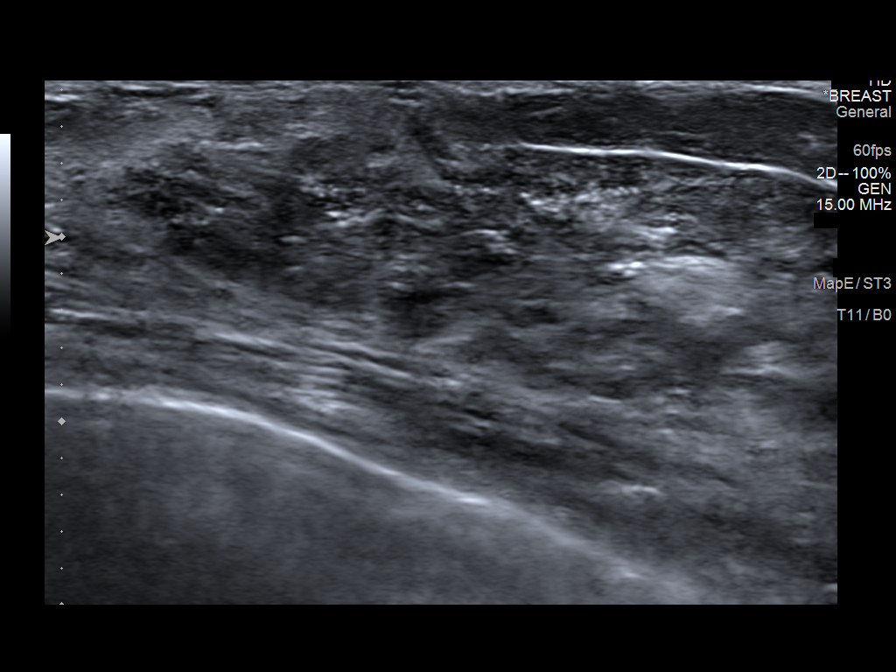
[im 3/7]
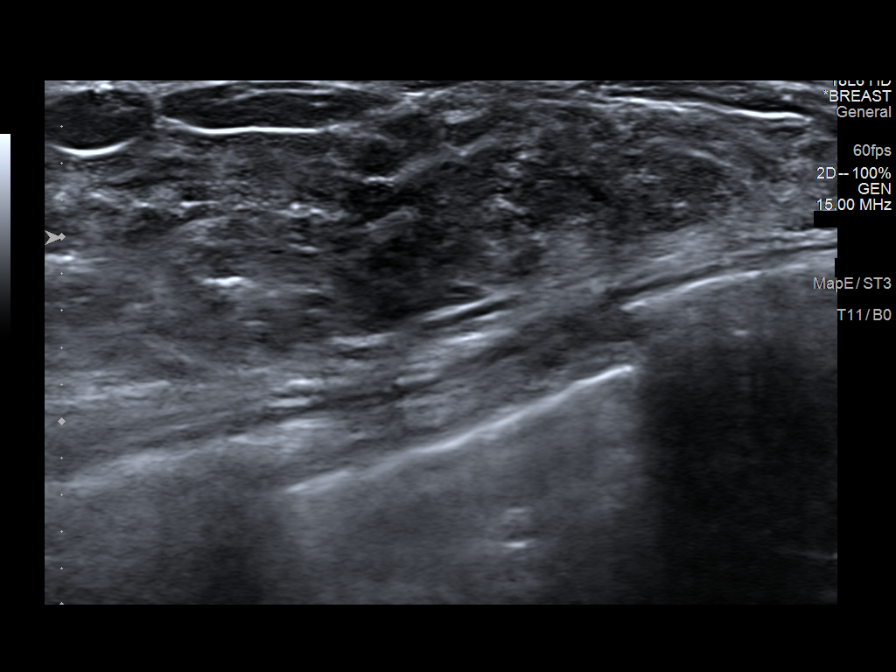
[im 4/7]
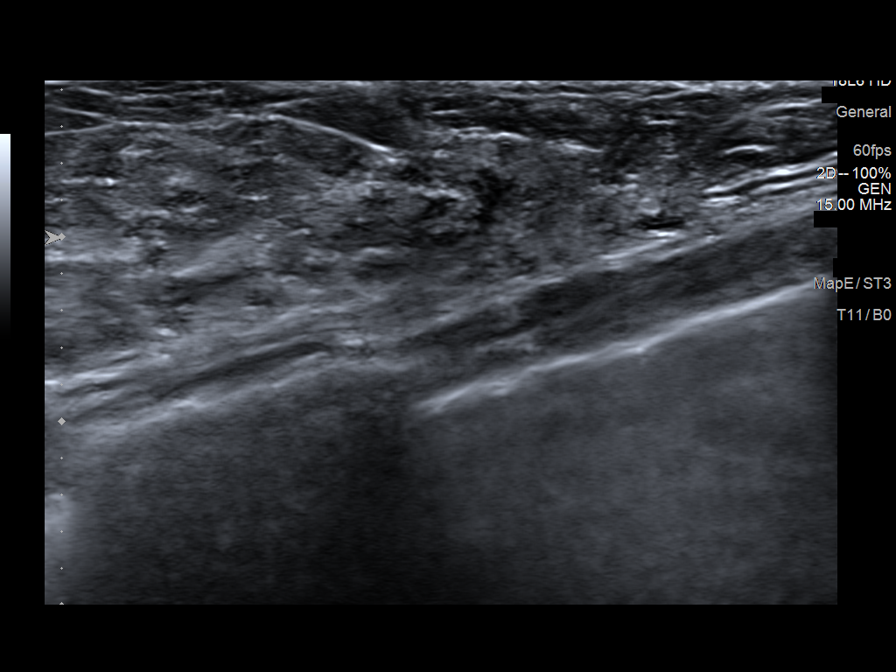
[im 5/7]
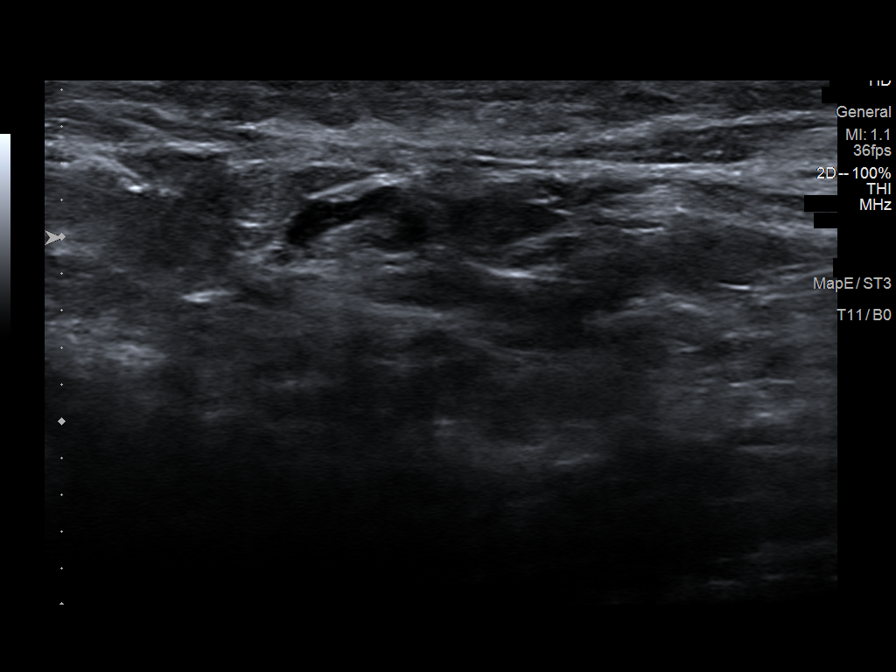
[im 6/7]
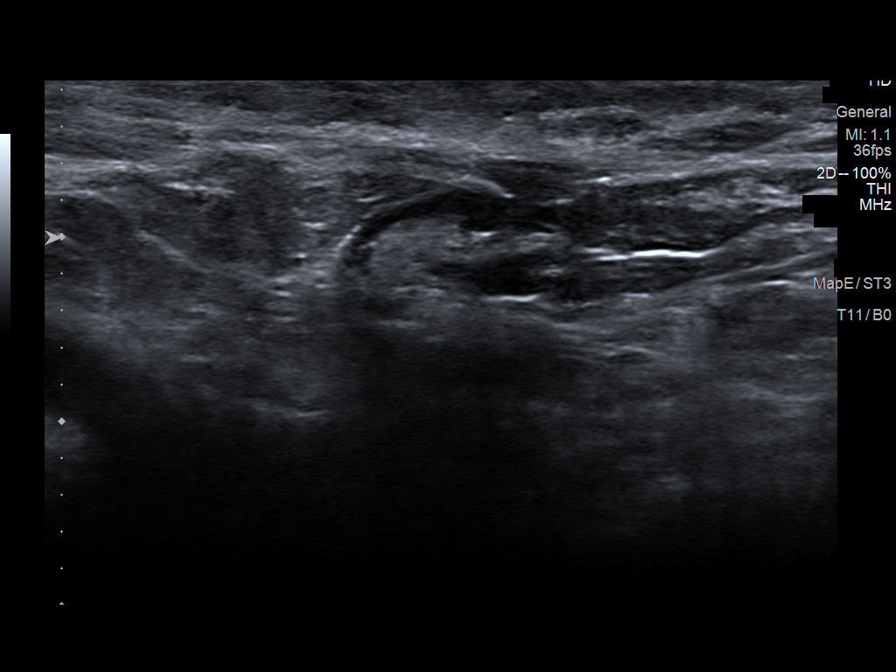
[im 7/7]
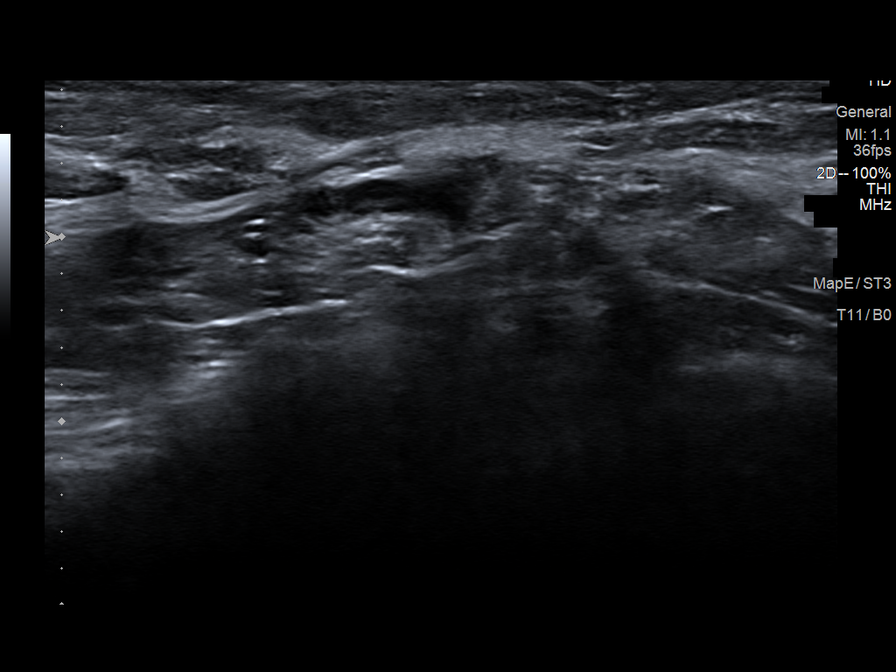

[7 of 7 positions shown; findings below may reference images not displayed]

ACR Breast Density Category d: The breast tissue is extremely dense,
which lowers the sensitivity of mammography.
FINDINGS: Magnification views of the left breast confirm a 1.0 x 0.6 x 0.6 cm
group of pleomorphic calcifications in the lower and slightly
medial/central left breast. Calcifications are in the middle third
of the breast parenchyma. There are a few singular scattered
calcifications in the left breast, but no additional focal grouping
of calcification is identified.

Mammographic images were processed with CAD.

On physical exam, I do not palpate a mass in the inferior left
breast.

Targeted ultrasound is performed, showing extremely dense breast
parenchyma in the inferior central/inferior medial left breast, with
no sonographic correlate to the calcifications, and no mass
identified.

Ultrasound of the left axilla is negative for lymphadenopathy.
IMPRESSION: Suspicious 1.0 cm group of calcifications in the lower inner
quadrant of the left breast. Patient reports a single recent episode
of brown to red fluid on the end of the left nipple.

Extremely dense breast parenchyma.

Negative for left axillary lymphadenopathy.

RECOMMENDATION:
Stereotactic biopsy of left breast calcifications is recommended and
is being scheduled for the patient. If the biopsy is positive for
malignancy, breast MRI is suggested for evaluation of extent of
disease.

I have discussed the findings and recommendations with the patient.
If applicable, a reminder letter will be sent to the patient
regarding the next appointment.

BI-RADS CATEGORY  4: Suspicious.

## 2021-04-22 ENCOUNTER — Ambulatory Visit
Admission: RE | Admit: 2021-04-22 | Discharge: 2021-04-22 | Disposition: A | Discharge status: Home / Self Care | Payer: No Typology Code available for payment source | Source: Ambulatory Visit | Attending: Oncology | Admitting: Oncology

## 2021-04-22 ENCOUNTER — Other Ambulatory Visit: Payer: Self-pay

## 2021-04-22 DIAGNOSIS — R922 Inconclusive mammogram: Secondary | ICD-10-CM

## 2021-04-22 DIAGNOSIS — Z9189 Other specified personal risk factors, not elsewhere classified: Secondary | ICD-10-CM

## 2021-04-22 DIAGNOSIS — C50312 Malignant neoplasm of lower-inner quadrant of left female breast: Secondary | ICD-10-CM

## 2021-04-22 MED ORDER — GADOBUTROL 1 MMOL/ML IV SOLN
4.0000 mL | Freq: Once | INTRAVENOUS | Status: AC | PRN
  Administered 2021-04-22: 4 mL via INTRAVENOUS

## 2021-05-13 NOTE — Progress Notes (Signed)
Karen Pruitt  [phone] [phone]     ID: Karen Pruitt DOB: January 11, 1971  MR#: [id number]  OAB#:[id number]  Patient Care Team: Noland Fordyce, MD as PCP - General (Obstetrics and Gynecology) Manus Rudd, MD as Consulting Physician (General Surgery) Anakaren Campion, Valentino Hue, MD as Consulting Physician (Oncology) Antony Blackbird, MD as Consulting Physician (Radiation Oncology) [place], Amy, MD as Consulting Physician (Dermatology) Pershing Proud, RN as Oncology Nurse Navigator Donnelly Angelica, RN as Oncology Nurse Navigator Kari Baars OTHER MD:  I connected with Driscilla Grammes on 05/13/21 at  1:30 PM EDT by telephone visit and verified that I am speaking with the correct person using two identifiers.   I discussed the limitations, risks, security and privacy concerns of performing an evaluation and management service by telemedicine and the availability of in-person appointments. I also discussed with the patient that there may be a patient responsible charge related to this service. The patient expressed understanding and agreed to proceed.   Other persons participating in the visit and their role in the encounter: None  Patient's location: Home Provider's location: [facility]   I provided 10 minutes of non face-to-face telephone visit time during this encounter, and > 50% was spent counseling as documented under my assessment & plan.   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 She continues on observation with intensified screening, as she opted against tamoxifen.  Since her last visit, she underwent bilateral diagnostic mammography with tomography at [facility] on 10/30/2020 showing: breast density category D; no evidence of  malignancy in either breast.   She also underwent screening breast MRI on 04/22/2021 showing: breast composition D; no evidence to suggest malignancy within either breast.   REVIEW OF SYSTEMS: Karen Pruitt is generally doing well.  For exercise she walks her dog, does yoga, barre, and generally something vigorous 3-4 times a week.  A detailed review of systems today was otherwise noncontributory   COVID 19 VACCINATION STATUS: Refuses vaccination; had COVID June 2022   HISTORY OF CURRENT ILLNESS: From the original intake note:  Karen Pruitt had routine screening mammography on 10/30/2019 showing a possible abnormality in the left breast. She underwent left diagnostic mammography with tomography and left breast ultrasonography at [facility] on 11/10/2019 showing: breast density category D; 1.0 cm group of calcifications in lower-inner left breast; negative for left axillary lymphadenopathy. At that time, she also noted a recent episode of dark colored nipple discharge.  Accordingly on 11/27/2019 she proceeded to biopsy of the left breast area in question. The pathology from this procedure ([id number]) showed: atypical ductal hyperplasia and flat epithelial atypia with calcifications; background breast parenchyma with fibrocystic change and pseudoangiomatous stromal hyperplasia.   She underwent breast MRI on 01/25/2020 showing: breast composition D; no evidence of breast malignancy; small bilateral breast cysts.  She proceeded to left lumpectomy on 02/07/2020 under Dr. Corliss Skains. 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 Prognostic indicators significant for: estrogen receptor, 95% positive and progesterone receptor, 10% positive, both with moderate staining intensity.   The patient's subsequent history is as detailed below.   PAST MEDICAL  HISTORY: Past Medical History:  Diagnosis Date   Anxiety    Anxiety and depression 06/20/2012  Depression    Family history of breast cancer    Family history of melanoma    Family history of pancreatic cancer    Interstitial cystitis    Maternal anemia complicating pregnancy, childbirth, or the puerperium 06/20/2012   Mild IDA w/ superimposed ABL anemia   Pelvic floor dysfunction    Postpartum care following cesarean delivery 06/20/2012   S/P cesarean section - elective primary 12/8 06/20/2012    PAST SURGICAL HISTORY: Past Surgical History:  Procedure Laterality Date   BREAST LUMPECTOMY WITH RADIOACTIVE SEED LOCALIZATION Left 02/07/2020   Procedure: LEFT BREAST LUMPECTOMY WITH RADIOACTIVE SEED LOCALIZATION;  Surgeon: Donnie Mesa, MD;  Location: [facility];  Service: General;  Laterality: Left;   CESAREAN SECTION  06/19/2012   Procedure: CESAREAN SECTION;  Surgeon: Floyce Stakes. Pamala Hurry, MD;  Location: [facility];  Service: Obstetrics;  Laterality: N/A;  Primary Cesarean Section    CESAREAN SECTION WITH BILATERAL TUBAL LIGATION N/A 09/05/2013   Procedure: CESAREAN SECTION WITH BILATERAL TUBAL LIGATION;  Surgeon: Lovenia Kim, MD;  Location: [facility];  Service: Obstetrics;  Laterality: N/A;   CYSTOSCOPY WITH HYDRODISTENSION AND BIOPSY     TOE SURGERY      FAMILY HISTORY: Family History  Problem Relation Age of Onset   Arthritis Mother    Bleeding Disorder Mother    Depression Mother    Hypertension Mother    Alcohol abuse Father    Cerebrovascular Accident Father    Depression Father    Heart disease Father    Pancreatic cancer Paternal Aunt    Melanoma Maternal Grandmother        in the eye   Liver disease Maternal Grandfather    Breast cancer Paternal Grandmother        dbl mastectomy, d in late 10s-60s   Dementia Paternal Grandfather    Other Paternal Aunt        COVID   Dementia Paternal 45   The patient's parents are both 61 years old as of August 2021.  Her paternal grandmother was BRCA+ and had metastatic breast cancer. There is a paternal aunt with cancer, possibly pancreatic. She reports melanoma in her maternal grandmother.  The patient has 1 sister, no brothers   GYNECOLOGIC HISTORY:  No LMP recorded. Menarche: 50 years old Age at first live birth: 50 years old Karen Pruitt P 2 LMP irregular, last menstrual period was June 2021. Contraceptive: used for approximately 12 years, s/p tubal ligation HRT n/a  Hysterectomy? no BSO? 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  Her husband [name] is Mudlogger of IT at [employer].  Their daughters are 60 and 15 years old as of August 2021.  The patient attends [employer]    ADVANCED DIRECTIVES: In the absence of any documentation to the contrary, the patient's spouse is their HCPOA.    HEALTH MAINTENANCE: Social History   Tobacco Use   Smoking status: Never   Smokeless tobacco: Never  Vaping Use   Vaping Use: Never used  Substance Use Topics   Alcohol use: Yes    Comment: occas   Drug use: No     Colonoscopy: n/a (age)  PAP: April 2021  Bone density: n/a (age)   No Known Allergies  Current Outpatient Medications  Medication Sig Dispense Refill   cetirizine (ZYRTEC) 10 MG chewable tablet Chew 10 mg by mouth daily.     tranexamic acid (LYSTEDA)  650 MG TABS tablet Take 2 tablets (1,300 mg total) by mouth 3 (three) times daily. 30 tablet    No current facility-administered medications for this visit.    OBJECTIVE:   There were no vitals filed for this visit.    There is no height or weight on file to calculate BMI.   Wt Readings from Last 3 Encounters:  09/23/20 105 lb 8 oz (47.9 kg)  05/27/20 108 lb 9.6 oz (49.3 kg)  03/13/20 106 lb (48.1 kg)      ECOG FS:1 - Symptomatic but completely ambulatory  Telemedicine visit 05/14/2021     LAB RESULTS:  CMP     Component Value Date/Time   NA 141  02/19/2020 1607   K 3.8 02/19/2020 1607   CL 105 02/19/2020 1607   CO2 28 02/19/2020 1607   GLUCOSE 96 02/19/2020 1607   BUN 8 02/19/2020 1607   CREATININE 0.85 02/19/2020 1607   CALCIUM 10.2 02/19/2020 1607   PROT 6.9 02/19/2020 1607   ALBUMIN 4.1 02/19/2020 1607   AST 19 02/19/2020 1607   ALT 18 02/19/2020 1607   ALKPHOS 54 02/19/2020 1607   BILITOT 0.9 02/19/2020 1607   GFRNONAA >60 02/19/2020 1607   GFRAA >60 02/19/2020 1607    No results found for: TOTALPROTELP, ALBUMINELP, A1GS, A2GS, BETS, BETA2SER, GAMS, MSPIKE, SPEI  Lab Results  Component Value Date   WBC 8.1 02/19/2020   NEUTROABS 4.1 02/19/2020   HGB 12.1 02/19/2020   HCT 36.2 02/19/2020   MCV 88.9 02/19/2020   PLT 236 02/19/2020    No results found for: LABCA2  No components found for: NVBTYO060  No results for input(s): INR in the last 168 hours.  No results found for: LABCA2  No results found for: OKH997  No results found for: FSF423  No results found for: TRV202  No results found for: CA2729  No components found for: HGQUANT  No results found for: CEA1 / No results found for: CEA1   No results found for: AFPTUMOR  No results found for: CHROMOGRNA  No results found for: KPAFRELGTCHN, LAMBDASER, KAPLAMBRATIO (kappa/lambda light chains)  No results found for: HGBA, HGBA2QUANT, HGBFQUANT, HGBSQUAN (Hemoglobinopathy evaluation)   No results found for: LDH  No results found for: IRON, TIBC, IRONPCTSAT (Iron and TIBC)  No results found for: FERRITIN  Urinalysis No results found for: COLORURINE, APPEARANCEUR, LABSPEC, PHURINE, GLUCOSEU, HGBUR, BILIRUBINUR, KETONESUR, PROTEINUR, UROBILINOGEN, NITRITE, LEUKOCYTESUR   STUDIES: MR BREAST W & WO CM SCREENING (GI)  Result Date: 04/22/2021 CLINICAL DATA:  Abbreviated Breast MRI for breast cancer screening. Patient with history left breast lumpectomy 02/07/2020 revealing low-grade DCIS. EXAM: BILATERAL ABBREVIATED BREAST MRI WITH AND WITHOUT  CONTRAST TECHNIQUE: Multiplanar, multisequence MR images of both breasts were obtained prior to and following the intravenous administration of 4 ml of Gadavist Three-dimensional MR images were rendered by post-processing of the original MR data on an independent workstation. The three-dimensional MR images were interpreted, and findings are reported in the following complete MRI report for this study. Three dimensional images were evaluated at the independent DynaCad workstation COMPARISON:  Previous exam(s). FINDINGS: Breast composition: d. Extreme fibroglandular tissue. Background parenchymal enhancement: Moderate. Right breast: No mass or abnormal enhancement. Left breast: No mass or abnormal enhancement. Lymph nodes: No abnormal appearing lymph nodes. Ancillary findings:  None. IMPRESSION: No MRI evidence to suggest malignancy within either breast. RECOMMENDATION: Recommend annual screening mammography. The patient is also eligible for annual Abbreviated Breast MRI if she wishes. BI-RADS CATEGORY  1: Negative. Electronically Signed   By: Lovey Newcomer M.D.   On: 04/22/2021 16:25   The Tyrer-Cisick score below is based only in the diagnosis of ADH; risk is greater with DCIS   ELIGIBLE FOR AVAILABLE RESEARCH PROTOCOL: AET  ASSESSMENT: 50 y.o. Karen Pruitt woman status post left breast lower inner quadrant biopsy 11/11/2019 showing atypical ductal hyperplasia  (a) breast MRI 01/25/2020 shows no mass or abnormal enhancement.  No adenopathy  (1) status post left lumpectomy 01/25/2020 showing ductal carcinoma in situ, grade 1, measuring 1.0 cm, with negative margins, estrogen and progesterone receptor positive  (2) adjuvant radiation: 04/01/2020 through 04/26/2020 Site Technique Total Dose (Gy) Dose per Fx (Gy) Completed Fx Beam Energies  Breast, Left: Breast_Lt 3D 40.05/40.05 2.67 15/15 6X  Breast, Left: Breast_Lt_Bst 3D 10/10 2 5/5 6X   (3) opted against antiestrogens (tamoxifen).  (4) genetics  testing 03/13/2020 through the Multi-Gene Panel offered by [facility] found no deleterious mutations in AIP, ALK, APC, ATM, AXIN2,BAP1,  BARD1, BLM, BMPR1A, BRCA1, BRCA2, BRIP1, CASR, CDC73, CDH1, CDK4, CDKN1B, CDKN1C, CDKN2A (p14ARF), CDKN2A (p16INK4a), CEBPA, CHEK2, CTNNA1, DICER1, DIS3L2, EGFR (c.2369C>T, p.Thr790Met variant only), EPCAM (Deletion/duplication testing only), FH, FLCN, GATA2, GPC3, GREM1 (Promoter region deletion/duplication testing only), HOXB13 (c.251G>A, p.Gly84Glu), HRAS, KIT, MAX, MEN1, MET, MITF (c.952G>A, p.Glu318Lys variant only), MLH1, MSH2, MSH3, MSH6, MUTYH, NBN, NF1, NF2, NTHL1, PALB2, PDGFRA, PHOX2B, PMS2, POLD1, POLE, POT1, PRKAR1A, PTCH1, PTEN, RAD50, RAD51C, RAD51D, RB1, RECQL4, RET, RNF43, RUNX1, SDHAF2, SDHA (sequence changes only), SDHB, SDHC, SDHD, SMAD4, SMARCA4, SMARCB1, SMARCE1, STK11, SUFU, TERC, TERT, TMEM127, TP53, TSC1, TSC2, VHL, WRN and WT1.    (a) CDC73 c.1333G>A and MSH6 c.1995B>C VUS identified on the multicancer panel.   (5) breast cancer high risk: Intensified screening  (a) mammography April yearly  (b) breast MRI October yearly   PLAN: Karen Pruitt is doing well overall and the plan is to continue intensified screening.  She will have her yearly mammogram in April and her next MRI of the breast will be in October.  We will see her again in November of next year.  She knows to call for any other issue that may develop before then.   Virgie Dad. Karen Mizzell, MD 05/13/2021 10:01 PM Medical Oncology and Hematology [facility] [place], [place] [address] Tel. [phone]    Fax. [phone]   This document serves as a record of services personally performed by Lurline Del, MD. It was created on his behalf by Wilburn Mylar, a trained medical scribe. The creation of this record is based on the scribe's personal observations and the provider's statements to them.   I, Lurline Del MD, have reviewed the above documentation  for accuracy and completeness, and I agree with the above.   *Total Encounter Time as defined by the Centers for Medicare and Medicaid Services includes, in addition to the face-to-face time of a patient visit (documented in the note above) non-face-to-face time: obtaining and reviewing outside history, ordering and reviewing medications, tests or procedures, care coordination (communications with other health care professionals or caregivers) and documentation in the medical record.

## 2021-05-14 ENCOUNTER — Inpatient Hospital Stay: Payer: No Typology Code available for payment source | Attending: Oncology | Admitting: Oncology

## 2021-05-14 DIAGNOSIS — N6092 Unspecified benign mammary dysplasia of left breast: Secondary | ICD-10-CM

## 2021-05-14 DIAGNOSIS — Z803 Family history of malignant neoplasm of breast: Secondary | ICD-10-CM

## 2021-05-14 DIAGNOSIS — C50312 Malignant neoplasm of lower-inner quadrant of left female breast: Secondary | ICD-10-CM

## 2021-05-14 DIAGNOSIS — Z9189 Other specified personal risk factors, not elsewhere classified: Secondary | ICD-10-CM

## 2021-05-14 DIAGNOSIS — R922 Inconclusive mammogram: Secondary | ICD-10-CM

## 2021-05-14 DIAGNOSIS — Z170 Estrogen receptor positive status [ER+]: Secondary | ICD-10-CM

## 2021-06-12 IMAGING — MG MM BREAST SURGICAL SPECIMEN
1 series · 2 of 2 positions shown · non-contrast
Comparison: Previous exam(s).

CLINICAL DATA: Status post surgical excision of the left breast.

EXAM:
SPECIMEN RADIOGRAPH OF THE LEFT BREAST

[Series 1: L · left · 0.07mm/px · 2 of 2 slices shown]
[im 1/2]
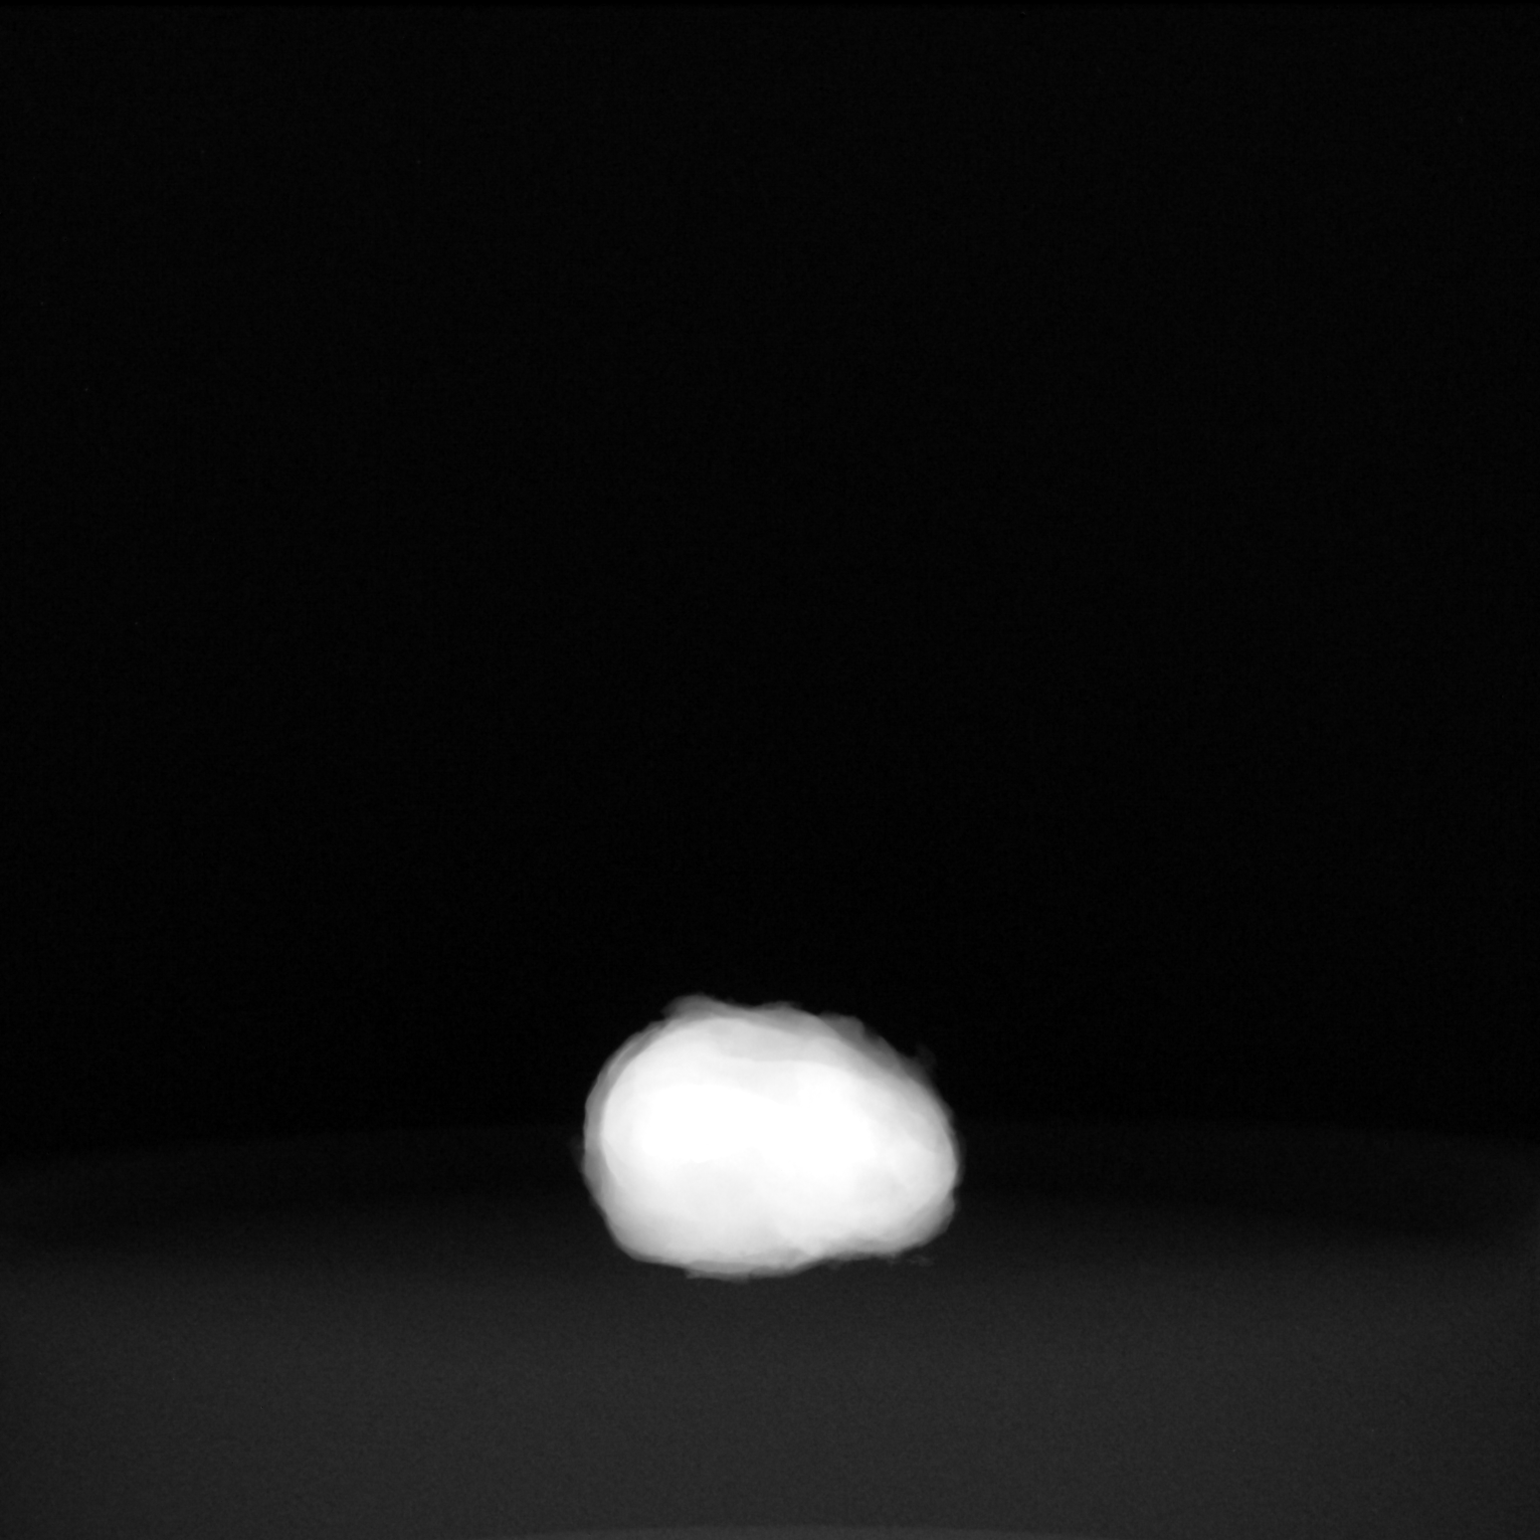
[im 2/2]
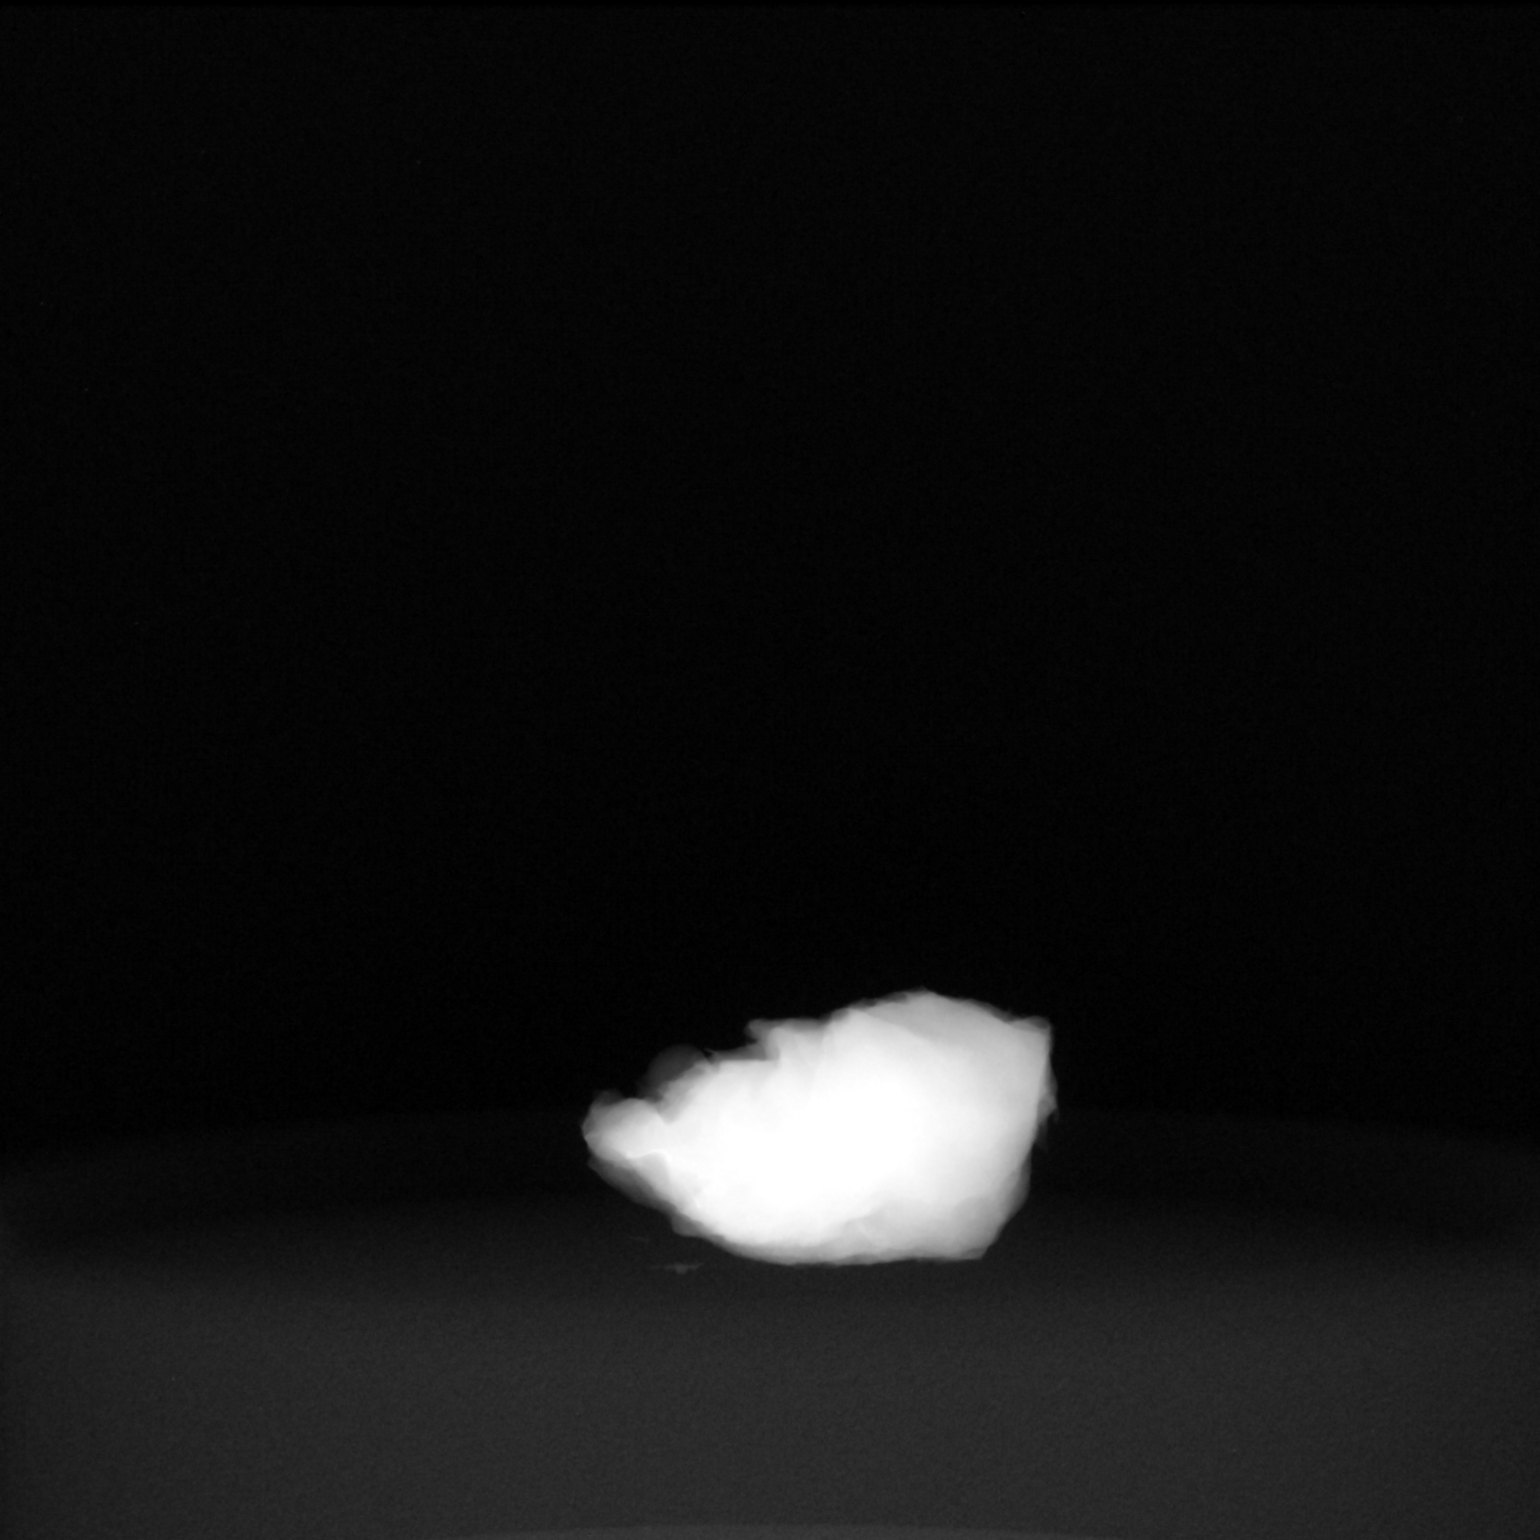

[2 of 2 positions shown; findings below may reference images not displayed]

FINDINGS: Status post excision of the left breast. The radioactive seed and
biopsy marker clip are present, completely intact, and were marked
for pathology.
IMPRESSION: Specimen radiograph of the left breast.

## 2021-10-29 ENCOUNTER — Other Ambulatory Visit: Payer: Self-pay

## 2021-10-29 ENCOUNTER — Other Ambulatory Visit: Payer: Self-pay | Admitting: Obstetrics

## 2021-10-29 DIAGNOSIS — Z170 Estrogen receptor positive status [ER+]: Secondary | ICD-10-CM

## 2021-10-29 NOTE — Progress Notes (Signed)
Pt is calling and requests order be put in for [facility]. Orders entered per MD then pt will have MRI in October. Pt verbalized thanks and understanding.  ?

## 2021-11-14 ENCOUNTER — Ambulatory Visit
Admission: RE | Admit: 2021-11-14 | Discharge: 2021-11-14 | Disposition: A | Discharge status: Home / Self Care | Payer: BC Managed Care – PPO | Source: Ambulatory Visit | Attending: Hematology and Oncology | Admitting: Hematology and Oncology

## 2021-11-14 DIAGNOSIS — C50312 Malignant neoplasm of lower-inner quadrant of left female breast: Secondary | ICD-10-CM

## 2022-03-17 ENCOUNTER — Other Ambulatory Visit: Payer: Self-pay

## 2022-03-17 ENCOUNTER — Other Ambulatory Visit: Payer: Self-pay | Admitting: Oncology

## 2022-03-17 DIAGNOSIS — C50312 Malignant neoplasm of lower-inner quadrant of left female breast: Secondary | ICD-10-CM

## 2022-03-17 DIAGNOSIS — R922 Inconclusive mammogram: Secondary | ICD-10-CM

## 2022-03-17 DIAGNOSIS — N6092 Unspecified benign mammary dysplasia of left breast: Secondary | ICD-10-CM

## 2022-03-17 DIAGNOSIS — Z803 Family history of malignant neoplasm of breast: Secondary | ICD-10-CM

## 2022-03-17 DIAGNOSIS — Z9189 Other specified personal risk factors, not elsewhere classified: Secondary | ICD-10-CM

## 2022-04-23 ENCOUNTER — Ambulatory Visit
Admission: RE | Admit: 2022-04-23 | Discharge: 2022-04-23 | Disposition: A | Discharge status: Home / Self Care | Payer: BC Managed Care – PPO | Source: Ambulatory Visit

## 2022-04-23 ENCOUNTER — Other Ambulatory Visit: Payer: Self-pay | Admitting: Hematology and Oncology

## 2022-04-23 DIAGNOSIS — C50312 Malignant neoplasm of lower-inner quadrant of left female breast: Secondary | ICD-10-CM

## 2022-04-23 DIAGNOSIS — R9230 Dense breasts, unspecified: Secondary | ICD-10-CM

## 2022-04-23 DIAGNOSIS — Z9189 Other specified personal risk factors, not elsewhere classified: Secondary | ICD-10-CM

## 2022-04-23 DIAGNOSIS — N6092 Unspecified benign mammary dysplasia of left breast: Secondary | ICD-10-CM

## 2022-04-23 DIAGNOSIS — Z803 Family history of malignant neoplasm of breast: Secondary | ICD-10-CM

## 2022-04-23 MED ORDER — GADOBUTROL 1 MMOL/ML IV SOLN
5.0000 mL | Freq: Once | INTRAVENOUS | Status: AC | PRN
  Administered 2022-04-23: 5 mL via INTRAVENOUS

## 2022-05-20 ENCOUNTER — Encounter: Payer: Self-pay | Admitting: Hematology and Oncology

## 2022-05-20 ENCOUNTER — Inpatient Hospital Stay: Payer: BC Managed Care – PPO | Attending: Hematology and Oncology | Admitting: Hematology and Oncology

## 2022-05-20 ENCOUNTER — Other Ambulatory Visit: Payer: Self-pay

## 2022-05-20 VITALS — BP 121/81 | HR 67 | Temp 97.9°F | Resp 16 | Ht 59.0 in | Wt 107.2 lb

## 2022-05-20 DIAGNOSIS — C50312 Malignant neoplasm of lower-inner quadrant of left female breast: Secondary | ICD-10-CM | POA: Diagnosis present

## 2022-05-20 DIAGNOSIS — Z806 Family history of leukemia: Secondary | ICD-10-CM | POA: Diagnosis not present

## 2022-05-20 DIAGNOSIS — Z803 Family history of malignant neoplasm of breast: Secondary | ICD-10-CM | POA: Diagnosis not present

## 2022-05-20 DIAGNOSIS — Z923 Personal history of irradiation: Secondary | ICD-10-CM | POA: Diagnosis not present

## 2022-05-20 DIAGNOSIS — Z170 Estrogen receptor positive status [ER+]: Secondary | ICD-10-CM | POA: Diagnosis not present

## 2022-05-20 NOTE — Progress Notes (Signed)
[place]  [phone]) [phone] Fax:(336) [phone]     ID: Karen Pruitt DOB: 1971/07/05  MR#: [id number]  CSN#:[id number]  Patient Care Team: Aloha Gell, MD as PCP - General (Obstetrics and Gynecology) Donnie Mesa, MD as Consulting Physician (General Surgery) Magrinat, Virgie Dad, MD (Inactive) as Consulting Physician (Oncology) Gery Pray, MD as Consulting Physician (Radiation Oncology) Martinique, Amy, MD as Consulting Physician (Dermatology) Mauro Kaufmann, RN as Oncology Nurse Navigator Rockwell Germany, RN as Oncology Nurse Navigator Benay Pike, MD OTHER MD:  CHIEF COMPLAINT: Noninvasive estrogen receptor positive breast cancer; 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 Since last visit, she had a MRI. She tells me that without insurance, MRI costed 400 $, with insurance, it costs 800 $. She is wondering if there is a need for MRI every yr or if she can do it every 2 yrs. She herself hasn't noticed any changes in the breast. She tells me that she is not interested in anti estrogen therapy. She had a MRI of breast on 04/23/2022, negative for malignancy.  REVIEW OF SYSTEMS:  Karen Pruitt is generally doing well.  For exercise she walks her dog, does yoga, barre, and generally something vigorous 3-4 times a week.  A detailed review of systems today was otherwise noncontributory   COVID 19 VACCINATION STATUS: Refuses vaccination; had COVID June 2022   HISTORY OF CURRENT ILLNESS: From the original intake note:  Karen Pruitt had routine screening mammography on 10/30/2019 showing a possible abnormality in the left breast. She underwent left diagnostic mammography with tomography and left breast ultrasonography at [facility] on 11/10/2019 showing: breast density category D; 1.0 cm group of calcifications in lower-inner left breast; negative for left axillary lymphadenopathy. At  that time, she also noted a recent episode of dark colored nipple discharge.  Accordingly on 11/27/2019 she proceeded to biopsy of the left breast area in question. The pathology from this procedure ([id number]) showed: atypical ductal hyperplasia and flat epithelial atypia with calcifications; background breast parenchyma with fibrocystic change and pseudoangiomatous stromal hyperplasia.   She underwent breast MRI on 01/25/2020 showing: breast composition D; no evidence of breast malignancy; small bilateral breast cysts.  She proceeded to left lumpectomy on 02/07/2020 under Dr. Georgette Dover. 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 Prognostic indicators significant for: estrogen receptor, 95% positive and progesterone receptor, 10% positive, both with moderate staining intensity.   The patient's subsequent history is as detailed below.   PAST MEDICAL HISTORY: Past Medical History:  Diagnosis Date   Anxiety    Anxiety and depression 06/20/2012   Breast cancer ([place]) 02/07/2020   Depression    Family history of breast cancer    Family history of melanoma    Family history of pancreatic cancer    Interstitial cystitis    Maternal anemia complicating pregnancy, childbirth, or the puerperium 06/20/2012   Mild IDA w/ superimposed ABL anemia   Pelvic floor dysfunction    Postpartum care following cesarean delivery 06/20/2012   S/P cesarean section - elective primary 12/8 06/20/2012    PAST SURGICAL HISTORY: Past Surgical History:  Procedure Laterality Date   BREAST LUMPECTOMY Left 02/07/2020   BREAST LUMPECTOMY WITH RADIOACTIVE SEED LOCALIZATION Left 02/07/2020   Procedure: LEFT BREAST LUMPECTOMY WITH RADIOACTIVE SEED LOCALIZATION;  Surgeon: Donnie Mesa, MD;  Location: [facility];  Service: General;  Laterality: Left;   CESAREAN  SECTION  06/19/2012   Procedure: CESAREAN SECTION;  Surgeon: Kelly A. Fogleman, MD;  Location: [facility];  Service: Obstetrics;  Laterality: N/A;  Primary Cesarean Section    CESAREAN SECTION WITH BILATERAL TUBAL LIGATION N/A 09/05/2013   Procedure: CESAREAN SECTION WITH BILATERAL TUBAL LIGATION;  Surgeon: Richard J Taavon, MD;  Location: [facility];  Service: Obstetrics;  Laterality: N/A;   CYSTOSCOPY WITH HYDRODISTENSION AND BIOPSY     TOE SURGERY      FAMILY HISTORY: Family History  Problem Relation Age of Onset   Arthritis Mother    Bleeding Disorder Mother    Depression Mother    Hypertension Mother    Alcohol abuse Father    Cerebrovascular Accident Father    Depression Father    Heart disease Father    Pancreatic cancer Paternal Aunt    Melanoma Maternal Grandmother        in the eye   Liver disease Maternal Grandfather    Breast cancer Paternal Grandmother        dbl mastectomy, d in late 50s-60s   Dementia Paternal Grandfather    Other Paternal Aunt        COVID   Dementia Paternal Aunt   The patient's parents are both 78 years old as of August 2021. Her paternal grandmother was BRCA+ and had metastatic breast cancer. There is a paternal aunt with cancer, possibly pancreatic. She reports melanoma in her maternal grandmother.  The patient has 1 sister, no brothers   GYNECOLOGIC HISTORY:  No LMP recorded. Menarche: 51 years old Age at first live birth: 51 years old GX P 2 LMP irregular, last menstrual period was June 2021. Contraceptive: used for approximately 12 years, s/p tubal ligation HRT n/a  Hysterectomy? no BSO? 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  Her husband Harold is director of IT at a local company.  Their daughters are 7 and 6 years old as of August 2021.  The patient attends [employer].    ADVANCED DIRECTIVES: In the absence of any documentation to the contrary, the patient's  spouse is their HCPOA.    HEALTH MAINTENANCE: Social History   Tobacco Use   Smoking status: Never   Smokeless tobacco: Never  Vaping Use   Vaping Use: Never used  Substance Use Topics   Alcohol use: Yes    Comment: occas   Drug use: No     Colonoscopy: n/a (age)  PAP: April 2021  Bone density: n/a (age)   No Known Allergies  Current Outpatient Medications  Medication Sig Dispense Refill   cetirizine (ZYRTEC) 10 MG chewable tablet Chew 10 mg by mouth daily.     tranexamic acid (LYSTEDA) 650 MG TABS tablet Take 2 tablets (1,300 mg total) by mouth 3 (three) times daily. 30 tablet    No current facility-administered medications for this visit.    OBJECTIVE:   There were no vitals filed for this visit.    There is no height or weight on file to calculate BMI.   Wt Readings from Last 3 Encounters:  09/23/20 105 lb 8 oz (47.9 kg)  05/27/20 108 lb 9.6 oz (49.3 kg)  03/13/20 106 lb (48.1 kg)      ECOG FS:1 - Symptomatic but completely ambulatory  Physical Exam Constitutional:      Appearance: Normal appearance.  Chest:       Comments: Bilateral breasts inspected, very dense on exam. No palpable masses or regional adenopathy Musculoskeletal:     Cervical back: No rigidity.  Lymphadenopathy:     Cervical: No cervical adenopathy.  Neurological:     Mental Status: She is alert.      LAB RESULTS:  CMP     Component Value Date/Time   NA 141 02/19/2020 1607   K 3.8 02/19/2020 1607   CL 105 02/19/2020 1607   CO2 28 02/19/2020 1607   GLUCOSE 96 02/19/2020 1607   BUN 8 02/19/2020 1607   CREATININE 0.85 02/19/2020 1607   CALCIUM 10.2 02/19/2020 1607   PROT 6.9 02/19/2020 1607   ALBUMIN 4.1 02/19/2020 1607   AST 19 02/19/2020 1607   ALT 18 02/19/2020 1607   ALKPHOS 54 02/19/2020 1607   BILITOT 0.9 02/19/2020 1607   GFRNONAA >60 02/19/2020 1607   GFRAA >60 02/19/2020 1607    No results found for: "TOTALPROTELP", "ALBUMINELP", "A1GS", "A2GS", "BETS",  "BETA2SER", "GAMS", "MSPIKE", "SPEI"  Lab Results  Component Value Date   WBC 8.1 02/19/2020   NEUTROABS 4.1 02/19/2020   HGB 12.1 02/19/2020   HCT 36.2 02/19/2020   MCV 88.9 02/19/2020   PLT 236 02/19/2020    No results found for: "LABCA2"  No components found for: "LABCAN125"  No results for input(s): "INR" in the last 168 hours.  No results found for: "LABCA2"  No results found for: "CAN199"  No results found for: "CAN125"  No results found for: "CAN153"  No results found for: "CA2729"  No components found for: "HGQUANT"  No results found for: "CEA1", "CEA" / No results found for: "CEA1", "CEA"   No results found for: "AFPTUMOR"  No results found for: "CHROMOGRNA"  No results found for: "KPAFRELGTCHN", "LAMBDASER", "KAPLAMBRATIO" (kappa/lambda light chains)  No results found for: "HGBA", "HGBA2QUANT", "HGBFQUANT", "HGBSQUAN" (Hemoglobinopathy evaluation)   No results found for: "LDH"  No results found for: "IRON", "TIBC", "IRONPCTSAT" (Iron and TIBC)  No results found for: "FERRITIN"  Urinalysis No results found for: "COLORURINE", "APPEARANCEUR", "LABSPEC", "PHURINE", "GLUCOSEU", "HGBUR", "BILIRUBINUR", "KETONESUR", "PROTEINUR", "UROBILINOGEN", "NITRITE", "LEUKOCYTESUR"   STUDIES: MR BREAST W & WO CM SCREENING (GI)  Result Date: 04/23/2022 CLINICAL DATA:  Abbreviated Breast MRI for breast cancer screening. Patient with history of left breast lumpectomy 2021. EXAM: BILATERAL ABBREVIATED BREAST MRI WITH AND WITHOUT CONTRAST TECHNIQUE: Multiplanar, multisequence MR images of both breasts were obtained prior to and following the intravenous administration of 5 ml of Gadavist Three-dimensional MR images were rendered by post-processing of the original MR data on an independent workstation. The three-dimensional MR images were interpreted, and findings are reported in the following complete MRI report for this study. Three dimensional images were evaluated at the  independent DynaCad workstation COMPARISON:  Abbreviated MRI 04/22/2021; diagnostic mammo Nov 14, 2021 FINDINGS: Breast composition: d. Extreme fibroglandular tissue. Background parenchymal enhancement: Mild Right breast: No mass or abnormal enhancement. Left breast: No mass or abnormal enhancement. Postsurgical changes left breast. Lymph nodes: No abnormal appearing lymph nodes. Ancillary findings:  None. IMPRESSION: No MRI evidence to suggest malignancy within either breast. RECOMMENDATION: Recommend annual screening mammography. The patient is also eligible for annual Abbreviated Breast MRI if she wishes. BI-RADS CATEGORY  2: Benign. Electronically Signed   By: Drew  Davis M.D.   On: 04/23/2022 11:57   The Tyrer-Cisick score below is based only in the diagnosis of ADH; risk is greater with DCIS   ELIGIBLE FOR AVAILABLE RESEARCH PROTOCOL: AET  ASSESSMENT: 50 y.o.   [place] woman status post left breast lower inner quadrant biopsy 11/11/2019 showing atypical ductal hyperplasia  (a) breast MRI 01/25/2020 shows no mass or abnormal enhancement.  No adenopathy  (1) status post left lumpectomy 01/25/2020 showing ductal carcinoma in situ, grade 1, measuring 1.0 cm, with negative margins, estrogen and progesterone receptor positive  (2) adjuvant radiation: 04/01/2020 through 04/26/2020 Site Technique Total Dose (Gy) Dose per Fx (Gy) Completed Fx Beam Energies  Breast, Left: Breast_Lt 3D 40.05/40.05 2.67 15/15 6X  Breast, Left: Breast_Lt_Bst 3D 10/10 2 5/5 6X   (3) opted against antiestrogens (tamoxifen).  (4) genetics testing 03/13/2020 through the Multi-Gene Panel offered by [facility] found no deleterious mutations in AIP, ALK, APC, ATM, AXIN2,BAP1,  BARD1, BLM, BMPR1A, BRCA1, BRCA2, BRIP1, CASR, CDC73, CDH1, CDK4, CDKN1B, CDKN1C, CDKN2A (p14ARF), CDKN2A (p16INK4a), CEBPA, CHEK2, CTNNA1, DICER1, DIS3L2, EGFR (c.2369C>T, p.Thr790Met variant only), EPCAM (Deletion/duplication testing only), FH, FLCN, GATA2,  GPC3, GREM1 (Promoter region deletion/duplication testing only), HOXB13 (c.251G>A, p.Gly84Glu), HRAS, KIT, MAX, MEN1, MET, MITF (c.952G>A, p.Glu318Lys variant only), MLH1, MSH2, MSH3, MSH6, MUTYH, NBN, NF1, NF2, NTHL1, PALB2, PDGFRA, PHOX2B, PMS2, POLD1, POLE, POT1, PRKAR1A, PTCH1, PTEN, RAD50, RAD51C, RAD51D, RB1, RECQL4, RET, RNF43, RUNX1, SDHAF2, SDHA (sequence changes only), SDHB, SDHC, SDHD, SMAD4, SMARCA4, SMARCB1, SMARCE1, STK11, SUFU, TERC, TERT, TMEM127, TP53, TSC1, TSC2, VHL, WRN and WT1.    (a) CDC73 c.1333G>A and MSH6 c.1995B>C VUS identified on the multicancer panel.   (5) breast cancer high risk: Intensified screening  (a) mammography April yearly  (b) breast MRI October yearly   PLAN:  Yanelli is doing well overall and the plan is to continue intensified screening.  She says her  MRI is very expensive especially without insurance. No concerns on physical exam today. I have reviewed the reason behind intensified screening. I think its reasonable to continue MRI and mammograms in staggered approach given early age at diagnosis, no anti estrogen therapy and very dense breasts. She is agreeable. She will continue SBE and RTC in one yr or sooner as needed.  Total time spent: 30 min   *Total Encounter Time as defined by the Centers for Medicare and Medicaid Services includes, in addition to the face-to-face time of a patient visit (documented in the note above) non-face-to-face time: obtaining and reviewing outside history, ordering and reviewing medications, tests or procedures, care coordination (communications with other health care professionals or caregivers) and documentation in the medical record. 

## 2022-11-19 ENCOUNTER — Ambulatory Visit
Admission: RE | Admit: 2022-11-19 | Discharge: 2022-11-19 | Disposition: A | Discharge status: Home / Self Care | Payer: BC Managed Care – PPO | Source: Ambulatory Visit | Attending: Hematology and Oncology | Admitting: Hematology and Oncology

## 2022-11-19 DIAGNOSIS — Z170 Estrogen receptor positive status [ER+]: Secondary | ICD-10-CM

## 2022-12-04 ENCOUNTER — Encounter: Payer: Self-pay | Admitting: Genetic Counselor

## 2022-12-04 NOTE — Progress Notes (Signed)
UPDATE: MSH6 c.1995G>C (p.Glu665Asp) VUS has been amended to Benign. Amended report date is Nov 30, 2022.

## 2022-12-17 ENCOUNTER — Encounter: Payer: Self-pay | Admitting: Family Medicine

## 2022-12-30 ENCOUNTER — Other Ambulatory Visit: Payer: Self-pay | Admitting: Family Medicine

## 2022-12-30 DIAGNOSIS — E785 Hyperlipidemia, unspecified: Secondary | ICD-10-CM

## 2023-01-27 ENCOUNTER — Ambulatory Visit
Admission: RE | Admit: 2023-01-27 | Discharge: 2023-01-27 | Disposition: A | Discharge status: Home / Self Care | Payer: No Typology Code available for payment source | Source: Ambulatory Visit | Attending: Family Medicine | Admitting: Family Medicine

## 2023-01-27 DIAGNOSIS — E785 Hyperlipidemia, unspecified: Secondary | ICD-10-CM

## 2023-02-02 ENCOUNTER — Telehealth: Payer: Self-pay | Admitting: *Deleted

## 2023-02-02 NOTE — Telephone Encounter (Addendum)
This RN spoke with pt per her call stating need for order for MRI of breast to be done in the fall.   Noted ordered in for MRI - informed pt who will call to schedule.  She also states she had a CT angio- which was negative except incidental finding of a 2 mm nodule in her left lung "I was told to contact you guys about this and how to follow up  This RN discussed above with pt - and per discussion pt states concern "I just have weird things that seem to happen to me"  Discussed possible need for follow up CT to monitor- noted nodule is very small. Will review with MD as well as when she is seen in October can review further.  Pt stated appreciation of discussion.

## 2023-03-20 IMAGING — MG DIGITAL DIAGNOSTIC BILAT W/ TOMO W/ CAD
6 of 11 series · 6 of 31 positions shown · non-contrast
Comparison: Previous exam(s).

CLINICAL DATA: Left lumpectomy.  Annual mammography.

EXAM:
DIGITAL DIAGNOSTIC BILATERAL MAMMOGRAM WITH TOMOSYNTHESIS AND CAD
TECHNIQUE: Bilateral digital diagnostic mammography and breast tomosynthesis
was performed. The images were evaluated with computer-aided
detection.

[L CC]
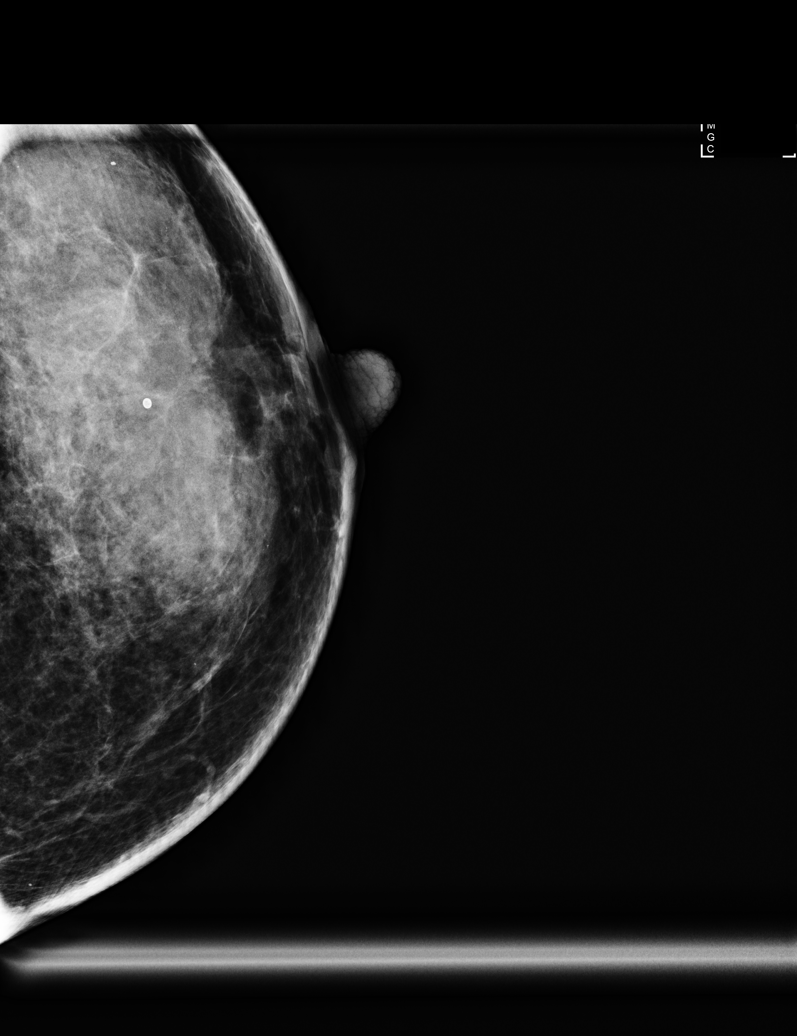

[R CC synth-2D]
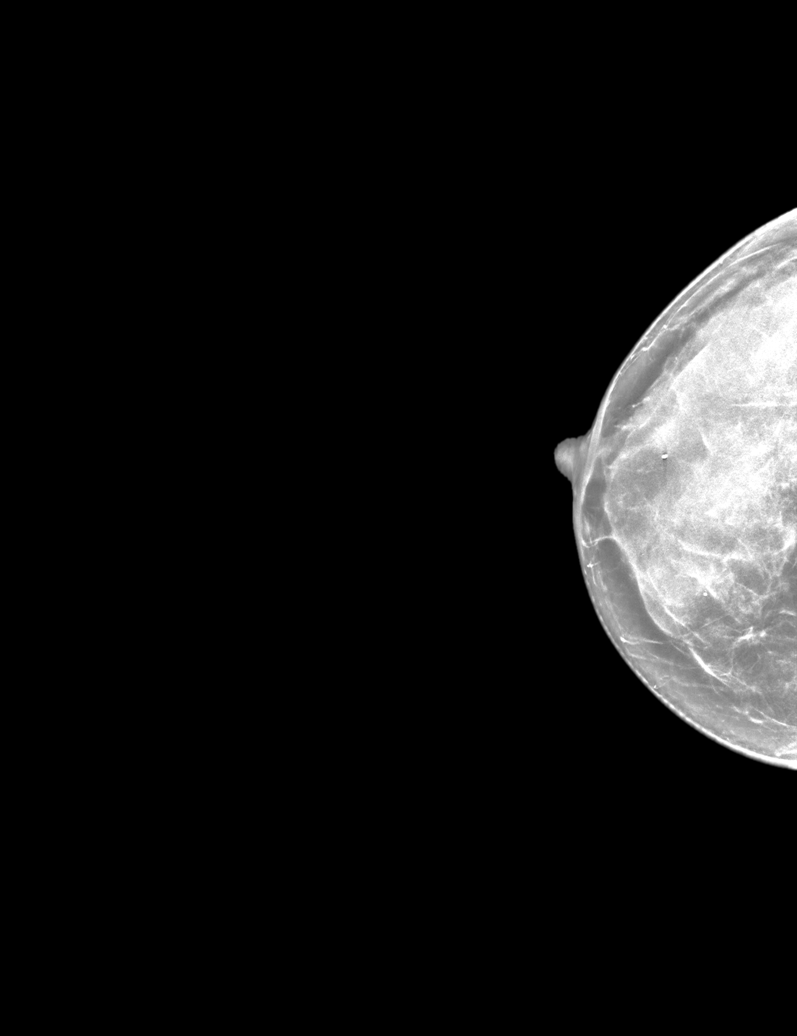

[L MLO synth-2D (1 of 2)]
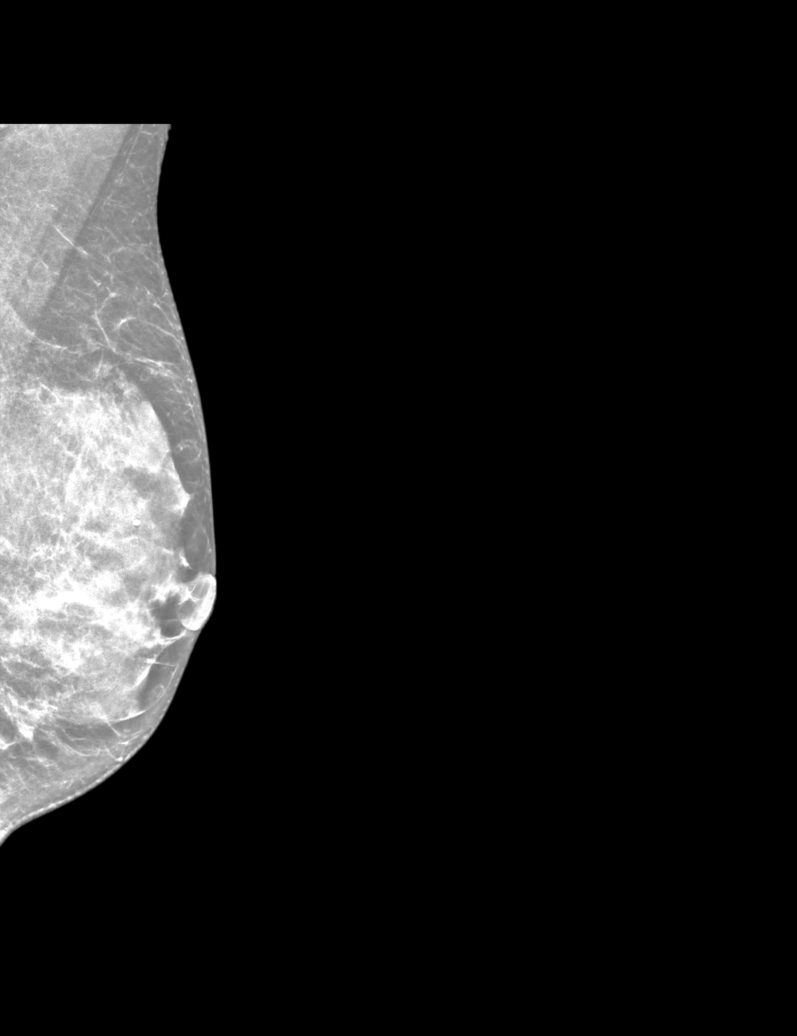

[L CC synth-2D]
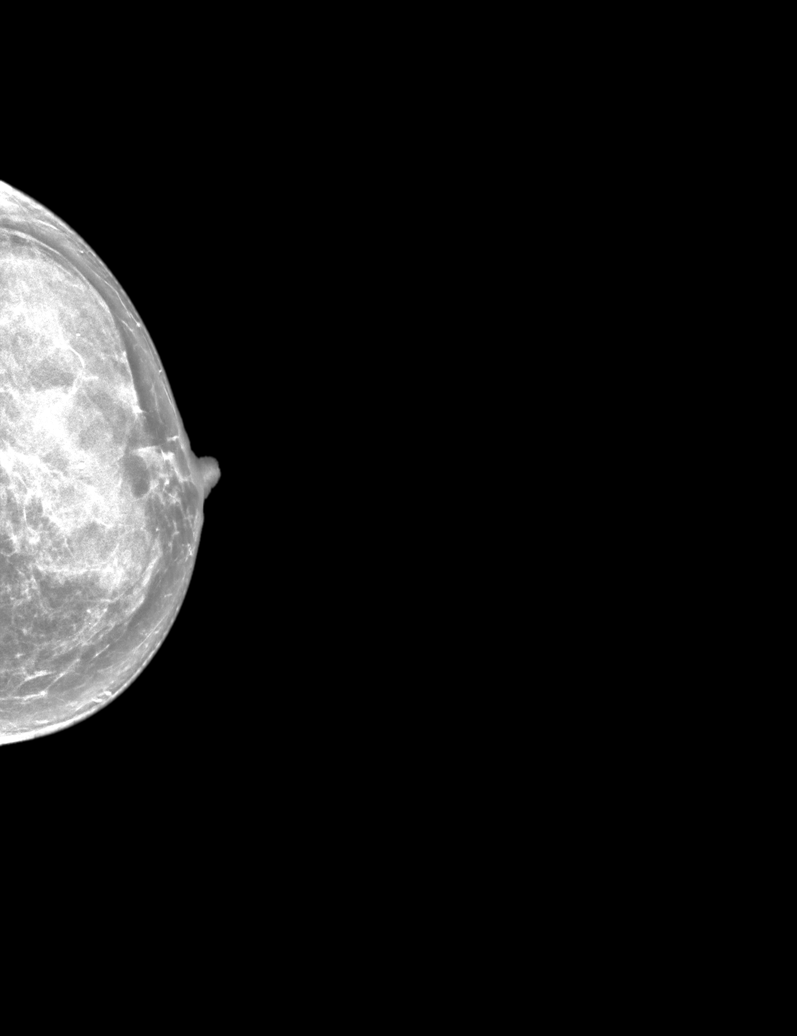

[R MLO synth-2D]
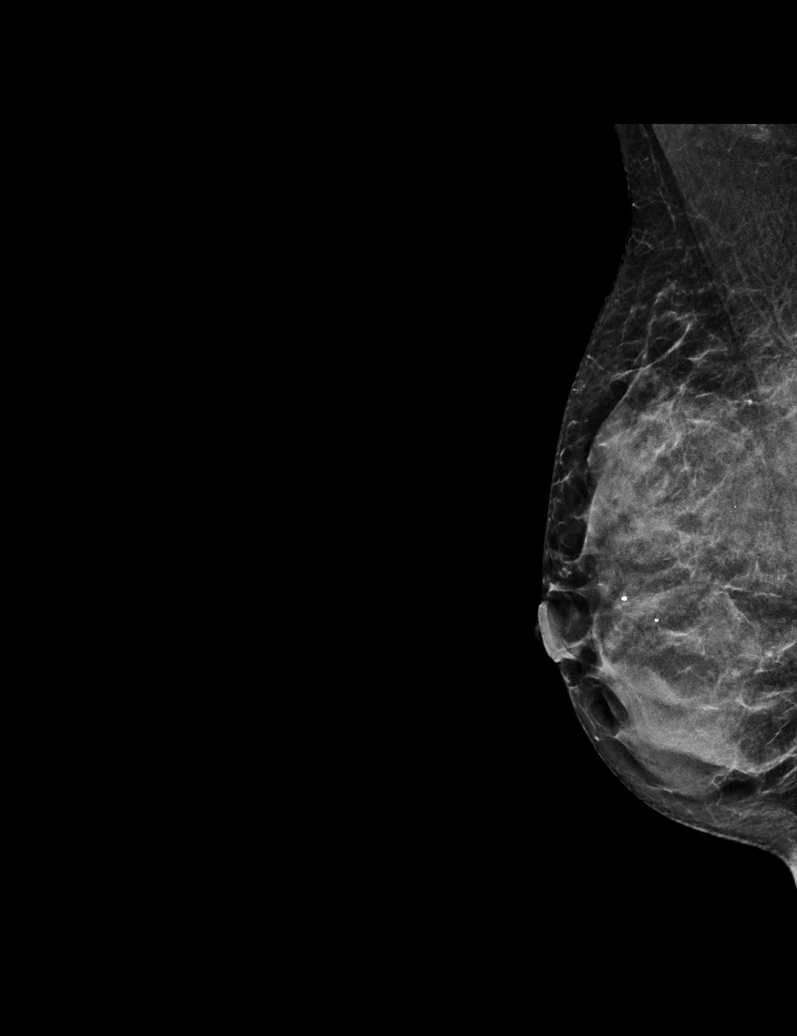

[L MLO synth-2D (2 of 2)]
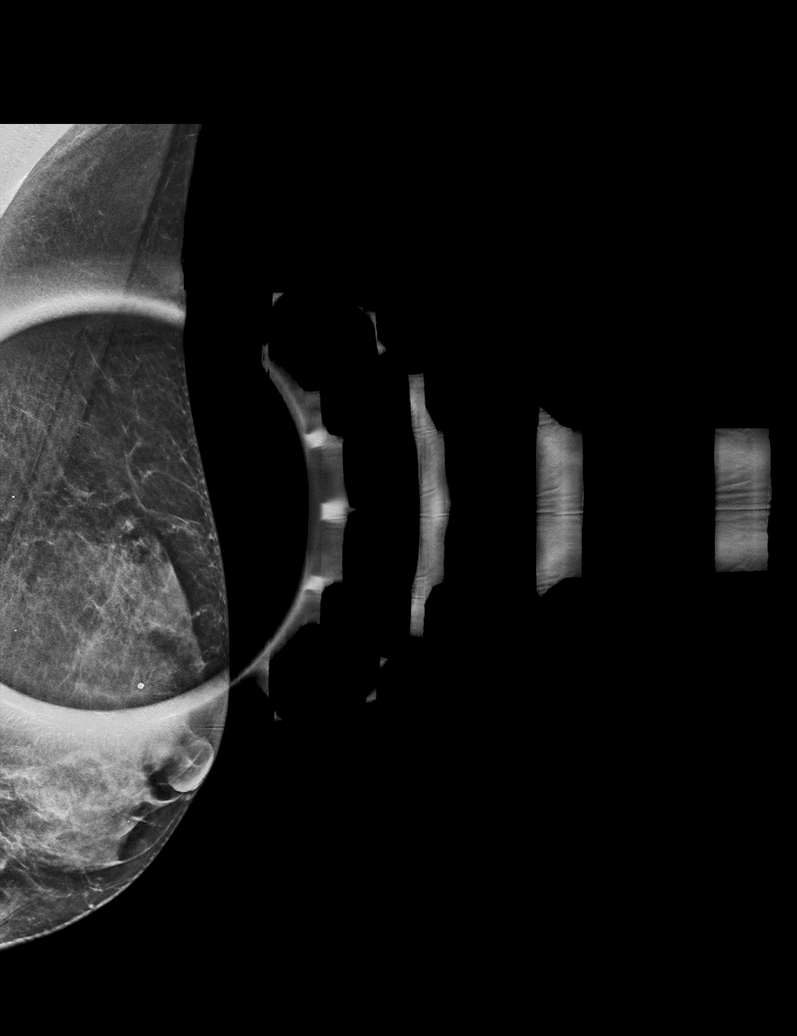

[6 of 31 positions shown; findings below may reference images not displayed]

ACR Breast Density Category c: The breast tissue is heterogeneously
dense, which may obscure small masses.
FINDINGS: The left lumpectomy site is stable. No other suspicious findings in
either breast.
IMPRESSION: No mammographic evidence of malignancy.

RECOMMENDATION:
Per protocol, as the patient is now 2 or more years status post
lumpectomy, she may return to annual screening mammography in 1
year. However, given the history of breast cancer, the patient
remains eligible for annual diagnostic mammography if preferred.

The patient had a breast MRI April 22, 2021 for a greater than 20%
lifetime risk of breast cancer. The American [employer] Society
recommends annual breast MRI in high risk patients.

I have discussed the findings and recommendations with the patient.
If applicable, a reminder letter will be sent to the patient
regarding the next appointment.

BI-RADS CATEGORY  2: Benign.

## 2023-04-27 ENCOUNTER — Ambulatory Visit (HOSPITAL_COMMUNITY)
Admission: RE | Admit: 2023-04-27 | Discharge: 2023-04-27 | Disposition: A | Discharge status: Home / Self Care | Payer: Self-pay | Source: Ambulatory Visit | Attending: Hematology and Oncology | Admitting: Hematology and Oncology

## 2023-04-27 DIAGNOSIS — Z170 Estrogen receptor positive status [ER+]: Secondary | ICD-10-CM | POA: Insufficient documentation

## 2023-04-27 DIAGNOSIS — C50312 Malignant neoplasm of lower-inner quadrant of left female breast: Secondary | ICD-10-CM | POA: Insufficient documentation

## 2023-04-27 MED ORDER — GADOBUTROL 1 MMOL/ML IV SOLN
5.0000 mL | Freq: Once | INTRAVENOUS | Status: AC | PRN
  Administered 2023-04-27: 5 mL via INTRAVENOUS

## 2023-11-29 ENCOUNTER — Other Ambulatory Visit: Payer: Self-pay | Admitting: Family Medicine

## 2023-11-29 DIAGNOSIS — R911 Solitary pulmonary nodule: Secondary | ICD-10-CM

## 2023-11-30 ENCOUNTER — Ambulatory Visit
Admission: RE | Admit: 2023-11-30 | Discharge: 2023-11-30 | Disposition: A | Discharge status: Home / Self Care | Source: Ambulatory Visit | Attending: Family Medicine | Admitting: Family Medicine

## 2023-11-30 DIAGNOSIS — R911 Solitary pulmonary nodule: Secondary | ICD-10-CM
